# Patient Record
Sex: Female | Born: 1935 | Race: Black or African American | Hispanic: No | Smoking: Current every day smoker
Health system: Southern US, Community
[De-identification: ages and names within clinical notes are randomized; demographics above are authoritative.]

## PROBLEM LIST (undated history)

## (undated) DIAGNOSIS — R05 Cough: Secondary | ICD-10-CM

## (undated) DIAGNOSIS — I639 Cerebral infarction, unspecified: Secondary | ICD-10-CM

## (undated) DIAGNOSIS — K5900 Constipation, unspecified: Secondary | ICD-10-CM

## (undated) DIAGNOSIS — I10 Essential (primary) hypertension: Secondary | ICD-10-CM

## (undated) DIAGNOSIS — R059 Cough, unspecified: Secondary | ICD-10-CM

## (undated) DIAGNOSIS — E559 Vitamin D deficiency, unspecified: Secondary | ICD-10-CM

## (undated) DIAGNOSIS — J309 Allergic rhinitis, unspecified: Secondary | ICD-10-CM

## (undated) DIAGNOSIS — G4700 Insomnia, unspecified: Secondary | ICD-10-CM

## (undated) DIAGNOSIS — D4959 Neoplasm of unspecified behavior of other genitourinary organ: Secondary | ICD-10-CM

## (undated) DIAGNOSIS — K219 Gastro-esophageal reflux disease without esophagitis: Secondary | ICD-10-CM

## (undated) HISTORY — PX: FOOT SURGERY: SHX648

---

## 2001-02-11 ENCOUNTER — Inpatient Hospital Stay (HOSPITAL_COMMUNITY): Admission: EM | Admit: 2001-02-11 | Discharge: 2001-02-13 | Payer: Self-pay | Admitting: Emergency Medicine

## 2001-02-11 ENCOUNTER — Encounter: Payer: Self-pay | Admitting: Emergency Medicine

## 2004-01-17 ENCOUNTER — Emergency Department (HOSPITAL_COMMUNITY): Admission: EM | Admit: 2004-01-17 | Discharge: 2004-01-17 | Payer: Self-pay | Admitting: Emergency Medicine

## 2006-03-18 ENCOUNTER — Emergency Department (HOSPITAL_COMMUNITY): Admission: EM | Admit: 2006-03-18 | Discharge: 2006-03-18 | Payer: Self-pay | Admitting: Emergency Medicine

## 2006-07-02 ENCOUNTER — Ambulatory Visit: Payer: Self-pay | Admitting: *Deleted

## 2006-07-02 ENCOUNTER — Inpatient Hospital Stay (HOSPITAL_COMMUNITY): Admission: EM | Admit: 2006-07-02 | Discharge: 2006-07-08 | Payer: Self-pay | Admitting: Emergency Medicine

## 2006-07-21 ENCOUNTER — Emergency Department (HOSPITAL_COMMUNITY): Admission: EM | Admit: 2006-07-21 | Discharge: 2006-07-21 | Payer: Self-pay | Admitting: Emergency Medicine

## 2010-02-24 ENCOUNTER — Encounter: Payer: Self-pay | Admitting: Geriatric Medicine

## 2010-06-19 NOTE — Op Note (Signed)
Debra York, Debra York            ACCOUNT NO.:  [id number]   MEDICAL RECORD NO.:  [id number]          PATIENT TYPE:  INP   LOCATION:  5022                         FACILITY:  [facility]   PHYSICIAN:  Lenard Galloway. Mortenson, M.D.DATE OF BIRTH:  1924/10/14   DATE OF PROCEDURE:  07/04/2006  DATE OF DISCHARGE:                               OPERATIVE REPORT   PREOPERATIVE DIAGNOSIS:  Intertrochanteric fracture, left hip.   POSTOPERATIVE DIAGNOSIS:  Intertrochanteric fracture, left hip.   OPERATION:  Open reduction and internal fixation using PEEK compression  screw system.   SURGEON:  Lenard Galloway. Chaney Malling, M.D.   ASSISTANT:  Arlys John D. Petrarca, P.A.-C.   ANESTHESIA:  General.   PROCEDURE:  The patient placed on the fracture table in supine position.  The entire left lower extremity was prepped with Betadine and draped out  in the usual manner.  A barrier drape was used to cover the hip and the  lower extremity.  An incision was made starting at the level of the  lesser trochanter and carried down for about 2 inches.  Dissection was  then carried down to the lateral side of the proximal femur.  Using  radiographic control, a 130-degree angle finder was used and placed  against the lateral aspect of the femur.  A drill hole was made and the  guide pin was passed up the femoral neck into the head.  Excellent  position was seen in both AP and lateral views.  Using a step-cut  cortical drill over the guide pin, a hole was placed in the lateral  cortex up the femoral neck.  C-arm was used throughout, and excellent  position of the hole was achieved.  An 85-mm screw was then selected and  passed over the guide pin up into the femoral head.  Once this was  accomplished to my satisfaction and excellent position of the pin, a 130-  degree, 4-hole side plate was selected and this was placed submuscularly  and then brought back onto the compression pin.  There was posterior  sagging of the distal  femoral fragment and several attempts were made to  bring this up to a more reduced position and through the small skin  incision, this was difficult.  The incision was then extended distally  so the entire length of the side plate could be seen.  The sagging  proximal femoral shaft was then brought up into almost anatomic position  and held in place with a Lowman clamp.  Drill holes were then placed  through the side plate.  Lengths were measured and appropriate-length  screws were inserted.  A much improved position was achieved.  A locking  screw was then placed in the compression screw.  X-rays were taken and  on the AP view an excellent reduction was achieved.  The tensor fascia  lata was closed with 0 Vicryl, subcutaneous tissue was closed with  Vicryl, and stainless steel staples were used to close the skin.  A  sterile dressing was applied and the patient returned to the recovery  room in excellent condition.   DRAINS:  None.  COMPLICATIONS:  None.           ______________________________  Lenard Galloway. Chaney Malling, M.D.     RAM/MEDQ  D:  07/04/2006  T:  07/05/2006  Job:  [id number]

## 2010-06-19 NOTE — Discharge Summary (Signed)
Debra York, Debra York            ACCOUNT NO.:  [id number]   MEDICAL RECORD NO.:  [id number]          PATIENT TYPE:  INP   LOCATION:  5022                         FACILITY:  [facility]   PHYSICIAN:  Lenard Galloway. Mortenson, M.D.DATE OF BIRTH:  08-30-1924   DATE OF ADMISSION:  07/02/2006  DATE OF DISCHARGE:  07/08/2006                               DISCHARGE SUMMARY   ADMISSION DIAGNOSES:  1. Left hip intertrochanteric fracture.  2. Hypertension.  3. History of cerebrovascular accident x3.  4. Chronic constipation.  5. Probable urinary tract infection.  6. Renal insufficiency.  7. Bradycardia.  8. Anemia.  9. Blind.   DISCHARGE DIAGNOSES:  1. Status post left hip compression screw.  2. Acute on chronic renal failure, resolved.  3. Thrombocytopenia, resolved.  4. Urinary tract infection.  5. Urinary retention.  6. Hyperkalemia.  7. Left foot drop questionable secondary to previous cerebrovascular      accidents versus postoperative change.  8. Normocytic anemia.  9. Diastolic dysfunction.  10.Pericardial effusion.  11.Fever and leukocytosis, resolved.  12.Chronic obstructive pulmonary disease, stable.   HISTORY OF PRESENT ILLNESS:  The patient is a 77 year old African-  American female with left hip pain status post fall.  The patient was  taking off her pajamas on Jul 01, 2006, and fell.  The patient  complained immediately of left hip pain and inability to walk.  No other  complaints.  The patient is a poor historian.  The patient [facility] at [facility].  She is widowed and smokes 1/2 pack of cigarettes a day.  No  alcohol or drug use.  On admission the patient was noted to have  decreased sensation in the left foot felt to be secondary to previous  strokes that effected the patient's left side.   ALLERGIES:  No known drug allergies.   MEDICATIONS:  1. Lipitor 20 mg q.a.m.  2. Tylenol 325 mg two tablets p.o. t.i.d.  3. Amlodipine 10 mg q.a.m.  4. Glucosamine q.a.m.  5.  Metoprolol 25 mg p.o. b.i.d.  6. Senokot q.h.s.  7. Ambien 5 mg q.h.s.  8. Mirtazapine 15 mg 1/2 tablet p.o. q.h.s.  9. Aspirin 325 mg daily.   PROCEDURE:  The patient was taken to the operating room on Jul 04, 2006,  by Thereasa Distance A. Chaney Malling, M.D. and assisted by Oris Drone. Petrarca, P.A.-C.  The patient was placed under general anesthesia and then underwent an  open reduction and internal fixation using the Peek compression screw  system.  The patient tolerated the procedure well and returned to the  recovery room in good stable condition.   CONSULTATIONS:  The following consults were obtained while the patient  was hospitalized.  1. Medicine Teaching Service.  2. PT/OT.  3. Pharmacy.  4. Case Management.   HOSPITAL COURSE:  The patient was initially in the ER and evaluated both  by orthopedics and medicine teaching service due to left hip pain status  post fall.  The patient was found to have a left intertrochanteric  subtrochanteric femur fracture.  The patient was also found to have  renal insufficiency, bradycardia, anemia, and history  of status post  CVA, and the patient was to be worked up prior to surgery and surgery  was held until medical workup could be obtained.   On hospital day #1, the patient's TMAX was 99.6, otherwise vital signs  stable.  H&H is 8.3 and 25, white count was 10,000, platelets 2008.  The  patient with hyperkalemia and this was secondary to her acute on chronic  renal failure.  The patient with a history of hypertension, borderline  hypotensive.  Norvasc was discontinued, but the metoprolol continued for  perioperative benefits.  The patient awaiting two-dimensional  echocardiogram to evaluate heart murmur.   On Jul 04, 2006, the patient's temperature was 100.7 and vital signs  otherwise stable.  H&H is 11.9 and 35.3, white count was 4100, platelets  102,000, BUN 20, creatinine 1.22.  I&O's 2185 and 925 out.  The  patient's renal failure was  improving.  Renal ultrasound was negative.  The patient's hyperkalemia was resolving and potassium was 4.6.  H&H was  improved status post transfusion.  The patient with acute onset of  thrombocytopenia, this is to be monitored.  Two-dimensional  echocardiogram showed diastolic dysfunction and septal thickening.  Aortic valve thickness was mildly increased.  Small IVC near total  respiratory compliance questionable, the patient with dehydration.  Small pericardial effusion noted, effusion dimension 6 mm anterior x 6  mm posterior.  The patient was cleared for surgery and was later that  day taken to the operating room and underwent an ORIF of the left hip  with compression screw.   On postoperative day #1, the patient was overall feeling well.  No chest  pain and no dyspnea.  Hemoglobin is 10.6, white count 11,100, platelets  137, potassium 4.1.  The patient is afebrile, vital signs otherwise  stable.  BUN 11 and creatinine 1.1.  The patient with UTI on Cipro.  Culture; Escherichia coli resistant to Cipro and ampicillin, but  susceptible to Bactrim.  Bactrim was started.  The patient was on  metoprolol for blood pressure.  COPD was stable.   On postoperative day #2, the patient was afebrile, vital signs were  stable, and H&H was 9.4 and 28, white count was 11,500, platelets 137.  Iron studies were ordered.  The patient was found to have normocytic  anemia.  Physician advised the patient to have a colonoscopy as an  outpatient.  Iron supplement was added.  The patient is to follow up  with PCP.  Otherwise, the patient is doing well.  The patient's  metoprolol was increased to 50 b.i.d.   On postoperative day #3, the patient's H&H was 8.7 and 25.6.  Medicine  reevaluated.  It was felt that the patient's anemia was unlikely related  to surgery, possible occult GI source.  Hemoccults were negative.  The patient otherwise felt tired.  No chest pain, no shortness of breath, no  nausea, pain  under good control.  White count was 12,000, platelets 181.  The patient was noted to have good sensation to light touch in toes of  the left foot.  Unable to dorsiflex the left foot.  Positive EHL and  FHL.  Dorsal pedal pulses 2+.  Foley was removed.  The patient had to be  in-and-out catheterized over the next 24 hours secondary to urinary  retention, total of 1000 mL was obtained with in-and-out  catheterization.   On postoperative day #4, July 08, 2006, the patient was still with  urinary retention.  At that  time of rounds, Urecholine was ordered.  Prior to the Urecholine arriving to the floor, the patient was able to  void.  In regards to the patient's anemia, guaiac stools were again  negative.  H&H was 9.9 and 29.7, up from 8.7 and 25.6, respectively the  day prior.  The patient on Bactrim day #2 of 5 for UTI.  The patient's  TMAX of 100.5, vital signs otherwise were stable.  CBG's 120 and 144.  The patient's had positive bowel movements.  EHL and FHL's were out,  previous day were in on the left.  No dorsiflexion or plantar flexion  left foot.  AFO was ordered to be worn at all times especially when up  ambulating.  However, the AFO does need to be removed approximately 4  hours for skin checks and the patient's comfort.  The patient was  otherwise felt to be in good stable condition and ready for discharge to  a skilled nursing facility.   LABORATORY DATA:  Routine labs on admission.  CBC; white blood cell count 11,600 elevated, hemoglobin 11.4, hematocrit  34.8, platelets 249.  Routine chemistries on admission; sodium 136,  potassium elevated at 5.5 mEq per liter, chloride 108, bicarb 21,  glucose high at 143 mg/dL, BUN 41 mg/dL, and creatinine was also  elevated at 136 mg/dL.  Hepatic enzymes on admission; ALP was low at  3.3, AST 29, ALT 18, ALP 91, total bilirubin 0.4.  Coags on admission;  PT 13.3, INR 1.0, PTT 26.  Urinalysis on admission; nitrites positive,  small  leukocyte esterase, bacteria few.  Urine culture from Jul 02, 2006, showed Escherichia coli greater than 100,000 colonies per mL  susceptible to Bactrim.   X-RAYS:  Left hip postoperatively showed two fluoroscopic intraoperative  spot films left hip demonstrating placement of dynamic hip screw for  fixation of the intertrochanteric fracture.   Left hip dated Jul 02, 2006, showed angulated comminuted  intertrochanteric fracture of the left femur.   Jul 02, 2006, lumbar spine complete showed degenerative changes without  acute osseous abnormalities, moderate osteopenia.   Chest x-ray two-view on Jul 02, 2006, showed no acute cardiopulmonary  disease, mild cardiomegaly with probable mild COPD.   Echocardiogram dated Jul 03, 2006, showed left ventricle small, overall  left ventricular systolic function was hyperdynamic, left ventricular  ejection fraction was estimated in the range of 65-70%.  No left ventricular regional wall abnormalities.  Mild focal basal septal  hypertrophy.  Doppler evidence of dynamic left ventricular mid cavity  obstruction at rest with peak velocity of 3 mm per second and with a  peak gradient of 36 mmHg consistent with an abnormal left ventricular  relaxation.  Aortic valve thickness was mildly increased.  Small IVC  with near total respiratory collapse, questionable dehydration.  Small  pericardial effusion measuring 6 mm anterior x 6 mm posterior.   EKG dated Jul 02, 2006, showed sinus bradycardia with a heart rate of 56  beats per minute, PR interval was 204 milliseconds, PRT axis was 76, 42,  81.   MEDICINES ON FLOOR:  1. Multivitamin one p.o. daily.  2. Remeron 7.5 one p.o. q.h.s.  3. Senna one tablet p.o. b.i.d.  4. Simvastatin 40 mg one p.o. daily at 1800 hours.  5. Protonix 40 mg p.o. daily.  6. Colace 100 mg one p.o. b.i.d.  7. Lovenox 40 mg subcu q.24 hours started on Jul 05, 2006, to be      continued for a total  of 14 days and the patient to  resume aspirin.  8. Bactrim one p.o. b.i.d. starting on July 06, 2006, for a total of 5      days.  9. Ferrous sulfate 325 mg one p.o. b.i.d.  10.Lopressor 50 mg one p.o. b.i.d.  11.Robaxin 500 mg IV q.6 hours p.r.n., last taken was Jul 05, 2006.  12.Ambien 5 mg one p.o. q.h.s. p.r.n.  None taken.  13.Albuterol two puffs q.4 hours p.r.n.  14.Vicodin 5/325 one tablet p.o. q.4 hours p.r.n.  15.Zofran 2-4 mg IV q.8 hours p.r.n. nausea.  16.Morphine 1 to 3 mg IV q.4 hours p.r.n.   DISCHARGE MEDICATIONS:  Floor medicines are to be adjusted by skilled  nursing facility physician.   DIET:  The patient is to have a regular diet.   WOUND CARE:  The wound is to be kept clean and dry.  Change dressing  daily.  The patient may shower after two days with no drainage.  Staples  to be removed at follow-up 14 days postoperatively.   FOLLOWUP:  The patient needs follow up with Dr. Chaney Malling in the office  14 days from surgery.  Please call [phone] for appointment.   ACTIVITY:  The patient is nonweightbearing with walker on the left leg.  The patient needs AFO applied when up ambulating.  AFO is to be removed  for skin checks daily and also to be removed for a total of 4 hours  daily for the patient's comfort.   DISCHARGE INSTRUCTIONS:  The patient needs colonoscopy as an outpatient.   CONDITION ON DISCHARGE:  The patient was discharged to skilled nursing  facility in good stable condition.      Richardean Canal, P.A.    ______________________________  Lenard Galloway Chaney Malling, M.D.    GC/MEDQ  D:  07/08/2006  T:  07/08/2006  Job:  [id number]   cc:   Thereasa Distance A. Chaney Malling, M.D.

## 2010-06-22 NOTE — Discharge Summary (Signed)
[place]  Patient:    Debra York, Debra York Visit Number: [id number] MRN: [id number]          Service Type: MED Location: [phone] Attending Physician:  Alfonso Ramus Dictated by:   Lendell Caprice, M.D. Admit Date:  02/11/2001 Disc. Date: 02/13/01   CC:         Dr. Ronn Melena, [facility], [place]  Patients physician, [facility] nursing home  Dr. Faylene Million, [place], [place]   Discharge Summary  DISCHARGE DIAGNOSES: 1. Left-sided facial droop, rule out cerebrovascular accident. 2. History of cerebrovascular accident x 3 in the past. 3. History of cerebral aneurysm clip, circle of Willis, in 1975. 4. History of uncontrolled hypertension. 5. History of recent foot surgery status post removal of desmoid bone on the    left by Dr. Faylene Million, podiatrist in [place]. 6. History of miscarriage in the past.  DISCHARGE MEDICATIONS: 1. Aspirin 325 mg p.o. q.d. 2. Keflex per Dr. Faylene Million, resume dose prior to admission. 3. Lamisil, resume dose prior to admission per Dr. Faylene Million 4. Tylenol 325 mg one to two tablets p.o. q.4h. p.r.n. pain.  FOLLOW-UP:  Debra York will be discharged to her previous nursing home where she lives in [place], [place].  She will return to [facility] on February 13, 2001.  She will need a regular hospital follow-up visit by the physician there at her nursing home.  There is no lab data for the physician at the nursing home to check on follow-up.  The patient did report constipation on admission and it would be important to assure that she is having regular bowel movements at follow-up.  PROCEDURES AND DIAGNOSTIC STUDIES:  A CT scan of the head was performed. Impression:  No definite acute intracranial disease.  There is an old right circle of Willis aneurysm clip with postsurgical changes and extensive right parietal encephalomalacia.  A 2-D echo performed on February 12, 2001, report pending at the time of discharge.   This will be sent to the nursing home after it is available.  Carotid Dopplers.  Impression:  Right internal carotid artery stenosis 60 to 80% estimation, otherwise negative.  CONSULTANTS:  None.  HISTORY OF PRESENT ILLNESS:  Debra York is a 77 year old African-American female with a past medical history of stroke x 3 with residual left arm and left leg hemiparesis.  She reports her first stroke was in 1975 and is unsure of the dates of her other two strokes.  She also has a past medical history of hypertension but takes no medication for her hypertension.  She presented to the emergency room from her nursing home, [facility], with the complaints of new onset slurred speech.  She also reports difficulty finding her words and new onset left facial droop with one episode of confusion this morning. According to the nursing home staff, the patient was in her usual state of health until the morning of admission around 10 a.m. when she became acutely confused.  They reported that she started slurring her words, had difficulty finding her words, and had new onset left-sided facial droop.  She was taken to the [facility] emergency room for evaluation and had improved by the time of her arrival.  She denied any other complaints in the emergency room.  She denied chest pain, nausea, vomiting, abdominal pain, vision changes, shortness of breath, fevers, and headache.  She also denied any recent change in her medications, trauma, recent travel, or sick contacts.  She denies any  syncope or loss of consciousness.  She does report occasional dizziness each morning when she wakes up.  ADMISSION LABORATORIES:  White blood count 4.2, hemoglobin 12.6, platelets 228, PT 13.4, PTT 26.  Sodium 137, potassium 4.0, chloride 104, CO2 28, BUN 17, creatinine 1.1, glucose 86, total protein 6.9, AST 32, ALT 18, albumin 3.7, alkaline phosphatase 65, total bilirubin 0.5.  HOSPITAL COURSE: #1 - PROBABLE  CEREBROVASCULAR ACCIDENT:  Debra York was evaluated in the emergency room and given aspirin on admission.  She was admitted to telemetry for observation for potential arrhythmias.  Her EKG was normal sinus rhythm at the rate of 59 beats per minute and no evidence of atrial fibrillation o arrhythmias.  She was put on stroke precautions and started on a dysphagia II diet in light of her slurred speech.  Physical therapy and occupational therapy consults were ordered.  She was put on aspiration precautions.  An MRI was unable to be obtained secondary to the patients extremely old aneurysm clip likely from 6.  Because of this clip, she is unable to undergo imaging in the MRI.  She was continued on her previous medications of Lamisil, Keflex, and given Colace for constipation.  She was evaluated overnight and had no events on telemetry.  On the day after admission, her symptoms had mostly resolved.  Her facial droop had improved and her speech had also improved. She denied any complaints of confusion, slurred speech, or facial droop on hospital day #2.  She will be discharged on aspirin 325 mg p.o. q.d.  No follow-up is necessary for her probable CVA.  She had no evidence of bleed on CT scan of her head, noncontrasted.  According to her carotid Dopplers, there was a right ICA 60 to 80% stenosis, otherwise negative.  A 2-D echocardiogram report will be available after discharge and this will be given to the nursing home.  Patient will be discharged on aspirin.  #2 - STATUS POST RECENT FOOT SURGERY:  The patient was continued on her medications that she came in on including Lamisil and Keflex per her podiatrist.  Her podiatrist, Dr. Faylene Million, was contacted about the patients admission and he will see the patient in follow-up after discharge.  #3 - CONSTIPATION:  The patient was given Colace and a suppository and had a  bowel movement during her hospitalization.  This patient might benefit  from a stool softener which can be added to her medication list after discharge.  #4 - NORMOCYTIC ANEMIA:  Patients hemoglobin on admission was 12.6 and, on hospital day #2, her hemoglobin was 11.0 with an MCV of 90.3.  Patient was having no evidence of bleeding, no hematemesis, no melena, no hematochezia. She denied any previous history of anemia.  An anemia workup was pursued and these results are pending at this time.  These will be followed up and sent to the nursing home after they are available.  A B12, RBC, folate, iron studies, and serum ferritin were obtained to work up her anemia.  In light of the normocytic nature of this anemia, it is unlikely this is secondary to iron deficiency.  #5 - DISPOSITION:  The patient will be discharged back to her nursing home at [facility].  In working this patient up, a urinalysis was performed which was negative for infection.  A fasting lipid panel was also obtained.  This patient does not need medication to treat her lipids in that they were within normal limits.  This patients symptoms had improved  by hospital day #2 and she will be discharged on hospital day #3 back to her nursing home.  She will need to be followed up by the [facility] physician in approximately one to two weeks.  DISCHARGE LABORATORIES:  Sodium 139, potassium 3.7, chloride 108, CO2 25, BUN 13, creatinine 1.0, calcium 8.4, glucose 85.  White blood count 4.9, hemoglobin 11, MCV 90.3, platelets 207. Dictated by:   Lendell Caprice, M.D. Attending Physician:  Alfonso Ramus DD:  02/12/01 TD:  02/12/01 Job: [id number]

## 2010-06-22 NOTE — Discharge Summary (Signed)
[place]  Patient:    Debra York, MCBREARTY Visit Number: [id number] MRN: [id number]          Service Type: MED Location: [phone] Attending Physician:  Alfonso Ramus Dictated by:   Lendell Caprice, M.D. Admit Date:  02/11/2001 Discharge Date: 02/13/2001   CC:         [facility]             Dr. Suzette Battiest, [place], [place]                           Discharge Summary  ADDENDUM   A 2D echocardiogram report obtained on Geneva Lenk performed on February 13, 2001, with impression of overall left ventricular systolic function was normal.  Left ventricular ejection fraction with estimated range being 55-65%. There were no left ventricular regional wall motion abnormalities.  Left ventricular wall thickness was mildly increased.  There was mild, focal, basal, septal hypertrophy.  [facility] should arrange for Ms. Westerhold to get a followup CT scan in one months time to further evaluate probable CVA.  The patient was discharged on aspirin 325 mg p.o. q.d.Dictated by:   Lendell Caprice, M.D. Attending Physician:  Alfonso Ramus DD:  02/14/01 TD:  02/16/01 Job: [id number]

## 2010-11-22 LAB — BASIC METABOLIC PANEL
BUN: 10
CO2: 26
CO2: 26
Calcium: 8 — ABNORMAL LOW
Calcium: 8.1 — ABNORMAL LOW
Chloride: 105
Creatinine, Ser: 1.05
Creatinine, Ser: 1.13
GFR calc Af Amer: 56 — ABNORMAL LOW
GFR calc non Af Amer: 46 — ABNORMAL LOW
Glucose, Bld: 118 — ABNORMAL HIGH

## 2010-11-22 LAB — RENAL FUNCTION PANEL
Albumin: 2.2 — ABNORMAL LOW
BUN: 7
Chloride: 105
GFR calc Af Amer: >60
GFR calc non Af Amer: 50 — ABNORMAL LOW
Potassium: 3.7

## 2010-11-22 LAB — CBC
HCT: 29.7 — ABNORMAL LOW
MCHC: 33.7
MCHC: 34.1
MCV: 91.3
Platelets: 137 — ABNORMAL LOW
Platelets: 207
RBC: 2.83 — ABNORMAL LOW
RBC: 3.06 — ABNORMAL LOW
RBC: 3.25 — ABNORMAL LOW
RDW: 13.8
WBC: 10.9 — ABNORMAL HIGH

## 2010-11-22 LAB — OCCULT BLOOD X 1 CARD TO LAB, STOOL: Fecal Occult Bld: NEGATIVE

## 2014-09-26 ENCOUNTER — Encounter (HOSPITAL_COMMUNITY): Payer: Self-pay

## 2014-09-26 ENCOUNTER — Emergency Department (HOSPITAL_COMMUNITY): Payer: Medicare Other

## 2014-09-26 ENCOUNTER — Inpatient Hospital Stay (HOSPITAL_COMMUNITY)
Admission: EM | Admit: 2014-09-26 | Discharge: 2014-09-28 | DRG: 082 | Payer: Medicare Other | Attending: Internal Medicine | Admitting: Internal Medicine

## 2014-09-26 DIAGNOSIS — S0081XA Abrasion of other part of head, initial encounter: Secondary | ICD-10-CM | POA: Diagnosis present

## 2014-09-26 DIAGNOSIS — I671 Cerebral aneurysm, nonruptured: Secondary | ICD-10-CM | POA: Diagnosis present

## 2014-09-26 DIAGNOSIS — W07XXXA Fall from chair, initial encounter: Secondary | ICD-10-CM | POA: Diagnosis present

## 2014-09-26 DIAGNOSIS — R748 Abnormal levels of other serum enzymes: Secondary | ICD-10-CM | POA: Diagnosis present

## 2014-09-26 DIAGNOSIS — S065X9A Traumatic subdural hemorrhage with loss of consciousness of unspecified duration, initial encounter: Secondary | ICD-10-CM

## 2014-09-26 DIAGNOSIS — E785 Hyperlipidemia, unspecified: Secondary | ICD-10-CM | POA: Diagnosis not present

## 2014-09-26 DIAGNOSIS — Y92129 Unspecified place in nursing home as the place of occurrence of the external cause: Secondary | ICD-10-CM | POA: Diagnosis not present

## 2014-09-26 DIAGNOSIS — Z79899 Other long term (current) drug therapy: Secondary | ICD-10-CM | POA: Diagnosis not present

## 2014-09-26 DIAGNOSIS — I69354 Hemiplegia and hemiparesis following cerebral infarction affecting left non-dominant side: Secondary | ICD-10-CM | POA: Diagnosis not present

## 2014-09-26 DIAGNOSIS — J309 Allergic rhinitis, unspecified: Secondary | ICD-10-CM | POA: Diagnosis present

## 2014-09-26 DIAGNOSIS — I6200 Nontraumatic subdural hemorrhage, unspecified: Secondary | ICD-10-CM | POA: Diagnosis not present

## 2014-09-26 DIAGNOSIS — F17210 Nicotine dependence, cigarettes, uncomplicated: Secondary | ICD-10-CM | POA: Diagnosis present

## 2014-09-26 DIAGNOSIS — E559 Vitamin D deficiency, unspecified: Secondary | ICD-10-CM | POA: Diagnosis present

## 2014-09-26 DIAGNOSIS — R0600 Dyspnea, unspecified: Secondary | ICD-10-CM | POA: Diagnosis not present

## 2014-09-26 DIAGNOSIS — I509 Heart failure, unspecified: Secondary | ICD-10-CM

## 2014-09-26 DIAGNOSIS — E876 Hypokalemia: Secondary | ICD-10-CM | POA: Diagnosis present

## 2014-09-26 DIAGNOSIS — S065XAA Traumatic subdural hemorrhage with loss of consciousness status unknown, initial encounter: Secondary | ICD-10-CM

## 2014-09-26 DIAGNOSIS — R0902 Hypoxemia: Secondary | ICD-10-CM | POA: Diagnosis present

## 2014-09-26 DIAGNOSIS — I10 Essential (primary) hypertension: Secondary | ICD-10-CM | POA: Diagnosis present

## 2014-09-26 DIAGNOSIS — Z7982 Long term (current) use of aspirin: Secondary | ICD-10-CM

## 2014-09-26 DIAGNOSIS — J189 Pneumonia, unspecified organism: Secondary | ICD-10-CM | POA: Diagnosis present

## 2014-09-26 DIAGNOSIS — D649 Anemia, unspecified: Secondary | ICD-10-CM | POA: Diagnosis present

## 2014-09-26 DIAGNOSIS — Z8673 Personal history of transient ischemic attack (TIA), and cerebral infarction without residual deficits: Secondary | ICD-10-CM

## 2014-09-26 DIAGNOSIS — K219 Gastro-esophageal reflux disease without esophagitis: Secondary | ICD-10-CM | POA: Diagnosis present

## 2014-09-26 DIAGNOSIS — Z66 Do not resuscitate: Secondary | ICD-10-CM | POA: Diagnosis present

## 2014-09-26 HISTORY — DX: Gastro-esophageal reflux disease without esophagitis: K21.9

## 2014-09-26 HISTORY — DX: Essential (primary) hypertension: I10

## 2014-09-26 HISTORY — DX: Cough: R05

## 2014-09-26 HISTORY — DX: Vitamin D deficiency, unspecified: E55.9

## 2014-09-26 HISTORY — DX: Cough, unspecified: R05.9

## 2014-09-26 HISTORY — DX: Constipation, unspecified: K59.00

## 2014-09-26 HISTORY — DX: Cerebral infarction, unspecified: I63.9

## 2014-09-26 HISTORY — DX: Neoplasm of unspecified behavior of other genitourinary organ: D49.59

## 2014-09-26 HISTORY — DX: Allergic rhinitis, unspecified: J30.9

## 2014-09-26 HISTORY — DX: Insomnia, unspecified: G47.00

## 2014-09-26 LAB — CBC WITH DIFFERENTIAL/PLATELET
BASOS ABS: 0 10*3/uL (ref 0.0–0.1)
Basophils Relative: 0 % (ref 0–1)
EOS ABS: 0.1 10*3/uL (ref 0.0–0.7)
EOS PCT: 2 % (ref 0–5)
HCT: 40.5 % (ref 36.0–46.0)
Hemoglobin: 12.8 g/dL (ref 12.0–15.0)
Lymphocytes Relative: 20 % (ref 12–46)
Lymphs Abs: 1.3 10*3/uL (ref 0.7–4.0)
MCH: 27.5 pg (ref 26.0–34.0)
MCHC: 31.6 g/dL (ref 30.0–36.0)
MCV: 86.9 fL (ref 78.0–100.0)
Monocytes Absolute: 1 10*3/uL (ref 0.1–1.0)
Monocytes Relative: 14 % — ABNORMAL HIGH (ref 3–12)
Neutro Abs: 4.4 10*3/uL (ref 1.7–7.7)
Neutrophils Relative %: 64 % (ref 43–77)
PLATELETS: 211 10*3/uL (ref 150–400)
RBC: 4.66 MIL/uL (ref 3.87–5.11)
RDW: 15.2 % (ref 11.5–15.5)
WBC: 6.9 10*3/uL (ref 4.0–10.5)

## 2014-09-26 LAB — I-STAT CHEM 8, ED
BUN: 14 mg/dL (ref 6–20)
CHLORIDE: 99 mmol/L — AB (ref 101–111)
CREATININE: 0.8 mg/dL (ref 0.44–1.00)
Calcium, Ion: 1.13 mmol/L (ref 1.13–1.30)
Glucose, Bld: 111 mg/dL — ABNORMAL HIGH (ref 65–99)
HEMATOCRIT: 44 % (ref 36.0–46.0)
Hemoglobin: 15 g/dL (ref 12.0–15.0)
Potassium: 3.3 mmol/L — ABNORMAL LOW (ref 3.5–5.1)
Sodium: 142 mmol/L (ref 135–145)
TCO2: 29 mmol/L (ref 0–100)

## 2014-09-26 LAB — COMPREHENSIVE METABOLIC PANEL
ALT: 15 U/L (ref 14–54)
AST: 24 U/L (ref 15–41)
Albumin: 3.3 g/dL — ABNORMAL LOW (ref 3.5–5.0)
Alkaline Phosphatase: 75 U/L (ref 38–126)
Anion gap: 7 (ref 5–15)
BUN: 14 mg/dL (ref 6–20)
CHLORIDE: 104 mmol/L (ref 101–111)
CO2: 30 mmol/L (ref 22–32)
CREATININE: 0.67 mg/dL (ref 0.44–1.00)
Calcium: 8.8 mg/dL — ABNORMAL LOW (ref 8.9–10.3)
GFR calc non Af Amer: >60 mL/min (ref >60)
Glucose, Bld: 112 mg/dL — ABNORMAL HIGH (ref 65–99)
Potassium: 3.3 mmol/L — ABNORMAL LOW (ref 3.5–5.1)
SODIUM: 141 mmol/L (ref 135–145)
Total Bilirubin: 1.2 mg/dL (ref 0.3–1.2)
Total Protein: 6.9 g/dL (ref 6.5–8.1)

## 2014-09-26 LAB — I-STAT CG4 LACTIC ACID, ED: Lactic Acid, Venous: 1.14 mmol/L (ref 0.5–2.0)

## 2014-09-26 LAB — BRAIN NATRIURETIC PEPTIDE: B Natriuretic Peptide: 1808.5 pg/mL — ABNORMAL HIGH (ref 0.0–100.0)

## 2014-09-26 MED ORDER — HYDRALAZINE HCL 20 MG/ML IJ SOLN
5.0000 mg | Freq: Once | INTRAMUSCULAR | Status: AC
  Administered 2014-09-26: 5 mg via INTRAVENOUS
  Filled 2014-09-26: qty 1

## 2014-09-26 NOTE — ED Notes (Signed)
MD made aware of elevated BP 

## 2014-09-26 NOTE — ED Notes (Addendum)
Per [facility]- pt resides at [facility]. FULL CODE.- Unwitnessed fall- downtime unknown. Found by residences.  Per staff at baseline. [facility] cleared. Laceration to the left side of forehead.-bleeding controlled. Pt without complaints. Pt states she was sitting in wheelchair and wheelchair went slightly off drop off and she went out of chair landing face first.

## 2014-09-26 NOTE — ED Notes (Signed)
Nurse drawing labs. 

## 2014-09-26 NOTE — ED Provider Notes (Signed)
CSN: [id number]     Arrival date & time 09/26/14  1813 History   First MD Initiated Contact with Patient 09/26/14 1832     Chief Complaint  Patient presents with  . Fall  . Laceration     (Consider location/radiation/quality/duration/timing/severity/associated sxs/prior Treatment) Patient is a 81 y.o. female presenting with fall and skin laceration. The history is provided by the patient (pt fell from wheelchair and hit her head).  Fall This is a new problem. The current episode started 3 to 5 hours ago. The problem occurs constantly. The problem has not changed since onset.Associated symptoms include headaches. Pertinent negatives include no chest pain and no abdominal pain. Nothing aggravates the symptoms. Nothing relieves the symptoms.  Laceration   Past Medical History  Diagnosis Date  . Hypertension   . Constipation   . Cerebral vascular accident   . Uterine neoplasm   . Cough   . Insomnia   . Gastroesophageal reflux disease   . Allergic rhinitis   . Vitamin D deficiency    No past surgical history on file. No family history on file. Social History  Substance Use Topics  . Smoking status: None  . Smokeless tobacco: None  . Alcohol Use: None   OB History    No data available     Review of Systems  Constitutional: Negative for appetite change and fatigue.  HENT: Negative for congestion, ear discharge and sinus pressure.   Eyes: Negative for discharge.  Respiratory: Positive for cough.   Cardiovascular: Negative for chest pain.  Gastrointestinal: Negative for abdominal pain and diarrhea.  Genitourinary: Negative for frequency and hematuria.  Musculoskeletal: Negative for back pain.  Skin: Negative for rash.  Neurological: Positive for headaches. Negative for seizures.  Psychiatric/Behavioral: Negative for hallucinations.      Allergies  Review of patient's allergies indicates no known allergies.  Home Medications   Prior to Admission medications    Medication Sig Start Date End Date Taking? Authorizing Provider  acetaminophen (TYLENOL) 325 MG tablet Take 650 mg by mouth every 6 (six) hours as needed for mild pain, fever or headache.   Yes Historical Provider, MD  aspirin 81 MG chewable tablet Chew 81 mg by mouth daily.   Yes Historical Provider, MD  cetirizine (ZYRTEC) 10 MG tablet Take 10 mg by mouth daily.   Yes Historical Provider, MD  cholecalciferol (VITAMIN D) 400 UNITS TABS tablet Take 800 Units by mouth.   Yes Historical Provider, MD  ferrous sulfate 325 (65 FE) MG tablet Take 325 mg by mouth daily with breakfast.   Yes Historical Provider, MD  hydrALAZINE (APRESOLINE) 25 MG tablet Take 25 mg by mouth 3 (three) times daily.   Yes Historical Provider, MD  hydrocerin (EUCERIN) CREA Apply 1 application topically 2 (two) times daily. To legs and feet.   Yes Historical Provider, MD  metoprolol (LOPRESSOR) 100 MG tablet Take 100 mg by mouth 2 (two) times daily.   Yes Historical Provider, MD  simvastatin (ZOCOR) 10 MG tablet Take 10 mg by mouth daily.   Yes Historical Provider, MD  sucralfate (CARAFATE) 1 G tablet Take 1 g by mouth 4 (four) times daily -  with meals and at bedtime.   Yes Historical Provider, MD   BP 187/85 mmHg  Pulse 62  Temp(Src) 97.8 F (36.6 C) (Oral)  Resp 18  SpO2 93% Physical Exam  Constitutional: She is oriented to person, place, and time. She appears well-developed.  HENT:  Head: Normocephalic.  Abrasion left forehead  Eyes: Conjunctivae and EOM are normal. No scleral icterus.  Neck: Neck supple. No thyromegaly present.  Cardiovascular: Normal rate and regular rhythm.  Exam reveals no gallop and no friction rub.   No murmur heard. Pulmonary/Chest: No stridor. She has no wheezes. She has rales. She exhibits no tenderness.  Abdominal: She exhibits no distension. There is no tenderness. There is no rebound.  Musculoskeletal: Normal range of motion. She exhibits no edema.  Right hip tender   Lymphadenopathy:    She has no cervical adenopathy.  Neurological: She is alert and oriented to person, place, and time.  Skin: No rash noted. No erythema.  Psychiatric: She has a normal mood and affect. Her behavior is normal.    ED Course  Procedures (including critical care time) Labs Review Labs Reviewed  CBC WITH DIFFERENTIAL/PLATELET - Abnormal; Notable for the following:    Monocytes Relative 14 (*)    All other components within normal limits  COMPREHENSIVE METABOLIC PANEL - Abnormal; Notable for the following:    Potassium 3.3 (*)    Glucose, Bld 112 (*)    Calcium 8.8 (*)    Albumin 3.3 (*)    All other components within normal limits  BRAIN NATRIURETIC PEPTIDE - Abnormal; Notable for the following:    B Natriuretic Peptide 1808.5 (*)    All other components within normal limits  I-STAT CHEM 8, ED - Abnormal; Notable for the following:    Potassium 3.3 (*)    Chloride 99 (*)    Glucose, Bld 111 (*)    All other components within normal limits  I-STAT CG4 LACTIC ACID, ED    Imaging Review Ct Head Wo Contrast  09/26/2014   CLINICAL DATA:  Unwitnessed fall. Down time unknown. Laceration to the left side of forehead. Patient fell out of wheelchair.  EXAM: CT HEAD WITHOUT CONTRAST  CT CERVICAL SPINE WITHOUT CONTRAST  TECHNIQUE: Multidetector CT imaging of the head and cervical spine was performed following the standard protocol without intravenous contrast. Multiplanar CT image reconstructions of the cervical spine were also generated.  COMPARISON:  None.  FINDINGS: CT HEAD FINDINGS  Postoperative changes with right temporal craniotomy and surgical clip in the right middle cranial fossa consistent with aneurysm clip. Encephalomalacia in the right frontotemporal and parietal lobe. Prominent ventricular dilatation consistent with volume loss. Diffuse cerebral atrophy.  There is focal acute subdural hematoma along the right frontoparietal region. Maximal depth is measured at  about 1 cm although most of the subdural hematoma is not that large. There is no significant mass effect or midline shift. No sub arachnoid, intraventricular, or intraparenchymal hemorrhage. No depressed skull fractures. Retention cysts in the right maxillary antrum and left sphenoid sinus. Mastoid air cells are not opacified. Vascular calcifications.  CT CERVICAL SPINE FINDINGS  There is reversal of the usual cervical lordosis, likely degenerative. No anterior subluxation. Normal alignment of facet joints. Coalition of the posterior elements at C3-4 bilaterally. No vertebral compression deformities. No prevertebral soft tissue swelling. Diffuse degenerative change throughout the cervical spine with narrowed cervical interspaces and endplate hypertrophic changes throughout. Degenerative changes throughout the facet joints. C1-2 articulation appears intact. Extensive vascular calcifications in the cervical carotid vessels. This likely results in severe calcific stenosis of both common carotid arteries. Mid bilateral pleural effusions, greater on the left. Emphysematous changes and fibrosis in the lung apices. Visualized esophagus appears dilated with an air-fluid level. This may be due dysmotility or distal esophageal narrowing.  IMPRESSION: Acute right subdural hematoma with maximal  depth of 1 cm. No mass effect or midline shift. Postoperative changes consistent with aneurysm clipping on the right with chronic encephalomalacia and cerebral atrophy.  Diffuse degenerative changes throughout the cervical spine likely accounts for reversal of cervical lordosis. No acute displaced fractures identified.  These results were called by telephone at the time of interpretation on 09/26/2014 at 8:51 pm to Dr. Milton Ferguson , who verbally acknowledged these results.   Electronically Signed   By: Lucienne Capers M.D.   On: 09/26/2014 20:59   Ct Cervical Spine Wo Contrast  09/26/2014   CLINICAL DATA:  Unwitnessed fall. Down  time unknown. Laceration to the left side of forehead. Patient fell out of wheelchair.  EXAM: CT HEAD WITHOUT CONTRAST  CT CERVICAL SPINE WITHOUT CONTRAST  TECHNIQUE: Multidetector CT imaging of the head and cervical spine was performed following the standard protocol without intravenous contrast. Multiplanar CT image reconstructions of the cervical spine were also generated.  COMPARISON:  None.  FINDINGS: CT HEAD FINDINGS  Postoperative changes with right temporal craniotomy and surgical clip in the right middle cranial fossa consistent with aneurysm clip. Encephalomalacia in the right frontotemporal and parietal lobe. Prominent ventricular dilatation consistent with volume loss. Diffuse cerebral atrophy.  There is focal acute subdural hematoma along the right frontoparietal region. Maximal depth is measured at about 1 cm although most of the subdural hematoma is not that large. There is no significant mass effect or midline shift. No sub arachnoid, intraventricular, or intraparenchymal hemorrhage. No depressed skull fractures. Retention cysts in the right maxillary antrum and left sphenoid sinus. Mastoid air cells are not opacified. Vascular calcifications.  CT CERVICAL SPINE FINDINGS  There is reversal of the usual cervical lordosis, likely degenerative. No anterior subluxation. Normal alignment of facet joints. Coalition of the posterior elements at C3-4 bilaterally. No vertebral compression deformities. No prevertebral soft tissue swelling. Diffuse degenerative change throughout the cervical spine with narrowed cervical interspaces and endplate hypertrophic changes throughout. Degenerative changes throughout the facet joints. C1-2 articulation appears intact. Extensive vascular calcifications in the cervical carotid vessels. This likely results in severe calcific stenosis of both common carotid arteries. Mid bilateral pleural effusions, greater on the left. Emphysematous changes and fibrosis in the lung apices.  Visualized esophagus appears dilated with an air-fluid level. This may be due dysmotility or distal esophageal narrowing.  IMPRESSION: Acute right subdural hematoma with maximal depth of 1 cm. No mass effect or midline shift. Postoperative changes consistent with aneurysm clipping on the right with chronic encephalomalacia and cerebral atrophy.  Diffuse degenerative changes throughout the cervical spine likely accounts for reversal of cervical lordosis. No acute displaced fractures identified.  These results were called by telephone at the time of interpretation on 09/26/2014 at 8:51 pm to Dr. Milton Ferguson , who verbally acknowledged these results.   Electronically Signed   By: Lucienne Capers M.D.   On: 09/26/2014 20:59   Dg Pelvis Portable  09/26/2014   CLINICAL DATA:  [address] out of wheelchair, unwitnessed fall at nursing home  EXAM: PORTABLE PELVIS 1-2 VIEWS  COMPARISON:  07/02/2006  FINDINGS: Osseous demineralization.  Hip and SI joints grossly symmetric and preserved.  Orthopedic hardware proximal LEFT femur post ORIF of an intertrochanteric fracture.  No definite acute fracture, dislocation, or bone destruction.  Soft tissue swelling laterally at LEFT hip region.  Extensive atherosclerotic calcifications.  IMPRESSION: Osseous demineralization with prior ORIF of the proximal LEFT femur.  No definite acute bony abnormalities.   Electronically Signed   By: Elta Guadeloupe  Thornton Papas M.D.   On: 09/26/2014 21:56   Dg Chest Port 1 View  09/26/2014   CLINICAL DATA:  Shortness of breath.  EXAM: PORTABLE CHEST - 1 VIEW  COMPARISON:  07/02/2006  FINDINGS: Mild cardiac enlargement noted. Diffuse bilateral hazy lung opacities are identified compatible with pulmonary edema. A more focal opacity is noted within the right midlung. The visualized osseous structures appear intact.  IMPRESSION: 1. Cardiac enlargement and pulmonary edema. 2. Focal opacity within the right midlung may represent superimposed pneumonia. Followup PA and  lateral chest X-ray is recommended in 3-4 weeks following trial of antibiotic therapy to ensure resolution and exclude underlying malignancy.   Electronically Signed   By: Kerby Moors M.D.   On: 09/26/2014 21:57   I have personally reviewed and evaluated these images and lab results as part of my medical decision-making. CRITICAL CARE Performed by: Corwyn Vora L Total critical care time: 45 Critical care time was exclusive of separately billable procedures and treating other patients. Critical care was necessary to treat or prevent imminent or life-threatening deterioration. Critical care was time spent personally by me on the following activities: development of treatment plan with patient and/or surrogate as well as nursing, discussions with consultants, evaluation of patient's response to treatment, examination of patient, obtaining history from patient or surrogate, ordering and performing treatments and interventions, ordering and review of laboratory studies, ordering and review of radiographic studies, pulse oximetry and re-evaluation of patient's condition.   EKG Interpretation   Date/Time:  Monday September 26 2014 21:47:12 EDT Ventricular Rate:  62 PR Interval:  163 QRS Duration: 139 QT Interval:  474 QTC Calculation: 481 R Axis:   162 Text Interpretation:  Sinus rhythm Consider left ventricular hypertrophy  Nonspecific T abnormalities, inferior leads Confirmed by Skarlette Lattner  MD,  Elhadj Girton [phone]) on 09/26/2014 10:55:21 PM     I spoke to neurosurgery about the subdural,  Neurosurgery reviewed the ct and will see pt in am. Pt needs new head ct in am MDM   Final diagnoses:  Subdural hematoma    Subdural hematoma,  Admit to medicine with neurosurgery consulted    Milton Ferguson, MD 09/26/14 2323

## 2014-09-26 NOTE — ED Notes (Signed)
MD at bedside. 

## 2014-09-26 NOTE — ED Notes (Signed)
Bed: HJ64 Expected date:  Expected time:  Means of arrival:  Comments: EMS/83F/fall/head lac

## 2014-09-27 ENCOUNTER — Inpatient Hospital Stay (HOSPITAL_COMMUNITY): Payer: Medicare Other

## 2014-09-27 ENCOUNTER — Encounter (HOSPITAL_COMMUNITY): Payer: Self-pay | Admitting: Internal Medicine

## 2014-09-27 DIAGNOSIS — I10 Essential (primary) hypertension: Secondary | ICD-10-CM | POA: Diagnosis present

## 2014-09-27 DIAGNOSIS — I6200 Nontraumatic subdural hemorrhage, unspecified: Secondary | ICD-10-CM

## 2014-09-27 DIAGNOSIS — I509 Heart failure, unspecified: Secondary | ICD-10-CM

## 2014-09-27 DIAGNOSIS — E785 Hyperlipidemia, unspecified: Secondary | ICD-10-CM

## 2014-09-27 DIAGNOSIS — R0600 Dyspnea, unspecified: Secondary | ICD-10-CM

## 2014-09-27 DIAGNOSIS — Z8673 Personal history of transient ischemic attack (TIA), and cerebral infarction without residual deficits: Secondary | ICD-10-CM

## 2014-09-27 LAB — COMPREHENSIVE METABOLIC PANEL
ALK PHOS: 63 U/L (ref 38–126)
ALT: 15 U/L (ref 14–54)
AST: 23 U/L (ref 15–41)
Albumin: 2.8 g/dL — ABNORMAL LOW (ref 3.5–5.0)
Anion gap: 9 (ref 5–15)
BUN: 9 mg/dL (ref 6–20)
CALCIUM: 8.6 mg/dL — AB (ref 8.9–10.3)
CO2: 29 mmol/L (ref 22–32)
CREATININE: 0.8 mg/dL (ref 0.44–1.00)
Chloride: 103 mmol/L (ref 101–111)
Glucose, Bld: 108 mg/dL — ABNORMAL HIGH (ref 65–99)
Potassium: 3.1 mmol/L — ABNORMAL LOW (ref 3.5–5.1)
SODIUM: 141 mmol/L (ref 135–145)
Total Bilirubin: 1.2 mg/dL (ref 0.3–1.2)
Total Protein: 5.8 g/dL — ABNORMAL LOW (ref 6.5–8.1)

## 2014-09-27 LAB — CBC WITH DIFFERENTIAL/PLATELET
Basophils Absolute: 0 10*3/uL (ref 0.0–0.1)
Basophils Relative: 0 % (ref 0–1)
EOS ABS: 0.1 10*3/uL (ref 0.0–0.7)
EOS PCT: 2 % (ref 0–5)
HCT: 37.5 % (ref 36.0–46.0)
Hemoglobin: 11.8 g/dL — ABNORMAL LOW (ref 12.0–15.0)
LYMPHS ABS: 0.9 10*3/uL (ref 0.7–4.0)
Lymphocytes Relative: 14 % (ref 12–46)
MCH: 27.4 pg (ref 26.0–34.0)
MCHC: 31.5 g/dL (ref 30.0–36.0)
MCV: 87 fL (ref 78.0–100.0)
Monocytes Absolute: 0.8 10*3/uL (ref 0.1–1.0)
Monocytes Relative: 13 % — ABNORMAL HIGH (ref 3–12)
Neutro Abs: 4.8 10*3/uL (ref 1.7–7.7)
Neutrophils Relative %: 71 % (ref 43–77)
PLATELETS: 199 10*3/uL (ref 150–400)
RBC: 4.31 MIL/uL (ref 3.87–5.11)
RDW: 15.3 % (ref 11.5–15.5)
WBC: 6.7 10*3/uL (ref 4.0–10.5)

## 2014-09-27 LAB — TSH: TSH: 0.645 u[IU]/mL (ref 0.350–4.500)

## 2014-09-27 LAB — PROCALCITONIN: Procalcitonin: <0.1 ng/mL

## 2014-09-27 LAB — TROPONIN I: Troponin I: 0.14 ng/mL — ABNORMAL HIGH (ref <0.031)

## 2014-09-27 MED ORDER — VANCOMYCIN HCL IN DEXTROSE 750-5 MG/150ML-% IV SOLN
750.0000 mg | INTRAVENOUS | Status: DC
  Administered 2014-09-27 – 2014-09-28 (×2): 750 mg via INTRAVENOUS
  Filled 2014-09-27 (×2): qty 150

## 2014-09-27 MED ORDER — LISINOPRIL 2.5 MG PO TABS
2.5000 mg | ORAL_TABLET | Freq: Every day | ORAL | Status: DC
  Administered 2014-09-27 – 2014-09-28 (×2): 2.5 mg via ORAL
  Filled 2014-09-27 (×2): qty 1

## 2014-09-27 MED ORDER — FERROUS SULFATE 325 (65 FE) MG PO TABS
325.0000 mg | ORAL_TABLET | Freq: Every day | ORAL | Status: DC
  Administered 2014-09-27 – 2014-09-28 (×2): 325 mg via ORAL
  Filled 2014-09-27 (×2): qty 1

## 2014-09-27 MED ORDER — HYDRALAZINE HCL 25 MG PO TABS
25.0000 mg | ORAL_TABLET | Freq: Three times a day (TID) | ORAL | Status: DC
  Administered 2014-09-27 – 2014-09-28 (×2): 25 mg via ORAL
  Filled 2014-09-27 (×2): qty 1

## 2014-09-27 MED ORDER — ACETAMINOPHEN 325 MG PO TABS
650.0000 mg | ORAL_TABLET | Freq: Four times a day (QID) | ORAL | Status: DC | PRN
Start: 2014-09-27 — End: 2014-09-28
  Administered 2014-09-27: 650 mg via ORAL
  Filled 2014-09-27: qty 2

## 2014-09-27 MED ORDER — PIPERACILLIN-TAZOBACTAM 3.375 G IVPB
3.3750 g | Freq: Three times a day (TID) | INTRAVENOUS | Status: DC
  Administered 2014-09-27 – 2014-09-28 (×4): 3.375 g via INTRAVENOUS
  Filled 2014-09-27 (×6): qty 50

## 2014-09-27 MED ORDER — ONDANSETRON HCL 4 MG/2ML IJ SOLN: 4.0000 mg | Freq: Four times a day (QID) | INTRAMUSCULAR | Status: DC | PRN

## 2014-09-27 MED ORDER — SIMVASTATIN 5 MG PO TABS
10.0000 mg | ORAL_TABLET | Freq: Every day | ORAL | Status: DC
  Administered 2014-09-27 – 2014-09-28 (×2): 10 mg via ORAL
  Filled 2014-09-27 (×2): qty 2

## 2014-09-27 MED ORDER — LORATADINE 10 MG PO TABS
10.0000 mg | ORAL_TABLET | Freq: Every day | ORAL | Status: DC
  Administered 2014-09-27 – 2014-09-28 (×2): 10 mg via ORAL
  Filled 2014-09-27 (×2): qty 1

## 2014-09-27 MED ORDER — POTASSIUM CHLORIDE CRYS ER 20 MEQ PO TBCR
20.0000 meq | EXTENDED_RELEASE_TABLET | Freq: Once | ORAL | Status: AC
  Administered 2014-09-27: 20 meq via ORAL
  Filled 2014-09-27: qty 1

## 2014-09-27 MED ORDER — TETANUS-DIPHTH-ACELL PERTUSSIS 5-2.5-18.5 LF-MCG/0.5 IM SUSP
0.5000 mL | Freq: Once | INTRAMUSCULAR | Status: DC
  Filled 2014-09-27: qty 0.5

## 2014-09-27 MED ORDER — ACETAMINOPHEN 325 MG PO TABS: 650.0000 mg | ORAL_TABLET | Freq: Four times a day (QID) | ORAL | Status: DC | PRN

## 2014-09-27 MED ORDER — HYDRALAZINE HCL 20 MG/ML IJ SOLN
10.0000 mg | INTRAMUSCULAR | Status: DC | PRN
  Administered 2014-09-27: 10 mg via INTRAVENOUS
  Filled 2014-09-27: qty 1

## 2014-09-27 MED ORDER — FUROSEMIDE 10 MG/ML IJ SOLN
10.0000 mg | Freq: Once | INTRAMUSCULAR | Status: AC
  Administered 2014-09-27: 10 mg via INTRAVENOUS
  Filled 2014-09-27: qty 4

## 2014-09-27 MED ORDER — METOPROLOL TARTRATE 100 MG PO TABS
100.0000 mg | ORAL_TABLET | Freq: Two times a day (BID) | ORAL | Status: DC
  Administered 2014-09-27 – 2014-09-28 (×3): 100 mg via ORAL
  Filled 2014-09-27 (×3): qty 1

## 2014-09-27 MED ORDER — ACETAMINOPHEN 650 MG RE SUPP
650.0000 mg | Freq: Four times a day (QID) | RECTAL | Status: DC | PRN
  Filled 2014-09-27: qty 1

## 2014-09-27 MED ORDER — SODIUM CHLORIDE 0.9 % IJ SOLN
3.0000 mL | Freq: Two times a day (BID) | INTRAMUSCULAR | Status: DC
  Administered 2014-09-27: 3 mL via INTRAVENOUS

## 2014-09-27 MED ORDER — ONDANSETRON HCL 4 MG PO TABS
4.0000 mg | ORAL_TABLET | Freq: Four times a day (QID) | ORAL | Status: DC | PRN
Start: 2014-09-27 — End: 2014-09-28

## 2014-09-27 MED ORDER — SUCRALFATE 1 G PO TABS
1.0000 g | ORAL_TABLET | Freq: Three times a day (TID) | ORAL | Status: DC
  Administered 2014-09-27 – 2014-09-28 (×3): 1 g via ORAL
  Filled 2014-09-27 (×3): qty 1

## 2014-09-27 NOTE — Progress Notes (Signed)
*  PRELIMINARY RESULTS* Echocardiogram 2D Echocardiogram has been performed.  Debra York 09/27/2014, 1:50 PM

## 2014-09-27 NOTE — Progress Notes (Signed)
Patient is alert to self and place at this time, refused breakfast "I don'[t want to eat". She is willing to take her medication and refused tetnus injection at this time "I don't want no shot, don't give me no shot". Fluid offered. Will continue to monitor.   Ave Filter, RN

## 2014-09-27 NOTE — Consult Note (Signed)
Reason for Consult: Subdural hematoma Referring Physician: Internal medicine  Debra York is an 81 y.o. female.  HPI: Patient is an 81 year old female who resides in a nursing home with a baseline left hemiplegia from previous right-sided CVA. Who had a ground-level fall off of her chair hitting her head. Patient brought to the emerge from and underwent CT scan of her head which showed a subdural hematoma and patient has been admitted for observation. Currently the patient is not complaining of any headache denies any nausea or vomiting.  Past Medical History  Diagnosis Date  . Hypertension   . Constipation   . Cerebral vascular accident   . Uterine neoplasm   . Cough   . Insomnia   . Gastroesophageal reflux disease   . Allergic rhinitis   . Vitamin D deficiency     Past Surgical History  Procedure Laterality Date  . Foot surgery      Family History  Problem Relation Age of Onset  . Stroke Neg Hx   . Diabetes Mellitus II Neg Hx     Social History:  reports that she has been smoking.  She does not have any smokeless tobacco history on file. She reports that she does not drink alcohol. Her drug history is not on file.  Allergies: No Known Allergies  Medications: I have reviewed the patient's current medications.  Results for orders placed or performed during the hospital encounter of 09/26/14 (from the past 48 hour(s))  CBC with Differential/Platelet     Status: Abnormal   Collection Time: 09/26/14 10:15 PM  Result Value Ref Range   WBC 6.9 4.0 - 10.5 K/uL   RBC 4.66 3.87 - 5.11 MIL/uL   Hemoglobin 12.8 12.0 - 15.0 g/dL   HCT 40.5 36.0 - 46.0 %   MCV 86.9 78.0 - 100.0 fL   MCH 27.5 26.0 - 34.0 pg   MCHC 31.6 30.0 - 36.0 g/dL   RDW 15.2 11.5 - 15.5 %   Platelets 211 150 - 400 K/uL   Neutrophils Relative % 64 43 - 77 %   Neutro Abs 4.4 1.7 - 7.7 K/uL   Lymphocytes Relative 20 12 - 46 %   Lymphs Abs 1.3 0.7 - 4.0 K/uL   Monocytes Relative 14 (H) 3 - 12 %    Monocytes Absolute 1.0 0.1 - 1.0 K/uL   Eosinophils Relative 2 0 - 5 %   Eosinophils Absolute 0.1 0.0 - 0.7 K/uL   Basophils Relative 0 0 - 1 %   Basophils Absolute 0.0 0.0 - 0.1 K/uL  Comprehensive metabolic panel     Status: Abnormal   Collection Time: 09/26/14 10:15 PM  Result Value Ref Range   Sodium 141 135 - 145 mmol/L   Potassium 3.3 (L) 3.5 - 5.1 mmol/L   Chloride 104 101 - 111 mmol/L   CO2 30 22 - 32 mmol/L   Glucose, Bld 112 (H) 65 - 99 mg/dL   BUN 14 6 - 20 mg/dL   Creatinine, Ser 0.67 0.44 - 1.00 mg/dL   Calcium 8.8 (L) 8.9 - 10.3 mg/dL   Total Protein 6.9 6.5 - 8.1 g/dL   Albumin 3.3 (L) 3.5 - 5.0 g/dL   AST 24 15 - 41 U/L   ALT 15 14 - 54 U/L   Alkaline Phosphatase 75 38 - 126 U/L   Total Bilirubin 1.2 0.3 - 1.2 mg/dL   GFR calc non Af Amer >60 >60 mL/min   GFR calc Af Amer >60 >60  mL/min    Comment: (NOTE) The eGFR has been calculated using the CKD EPI equation. This calculation has not been validated in all clinical situations. eGFR's persistently <60 mL/min signify possible Chronic Kidney Disease.    Anion gap 7 5 - 15  Brain natriuretic peptide     Status: Abnormal   Collection Time: 09/26/14 10:16 PM  Result Value Ref Range   B Natriuretic Peptide 1808.5 (H) 0.0 - 100.0 pg/mL  I-stat chem 8, ed     Status: Abnormal   Collection Time: 09/26/14 10:21 PM  Result Value Ref Range   Sodium 142 135 - 145 mmol/L   Potassium 3.3 (L) 3.5 - 5.1 mmol/L   Chloride 99 (L) 101 - 111 mmol/L   BUN 14 6 - 20 mg/dL   Creatinine, Ser 0.80 0.44 - 1.00 mg/dL   Glucose, Bld 111 (H) 65 - 99 mg/dL   Calcium, Ion 1.13 1.13 - 1.30 mmol/L   TCO2 29 0 - 100 mmol/L   Hemoglobin 15.0 12.0 - 15.0 g/dL   HCT 44.0 36.0 - 46.0 %  I-Stat CG4 Lactic Acid, ED     Status: None   Collection Time: 09/26/14 10:22 PM  Result Value Ref Range   Lactic Acid, Venous 1.14 0.5 - 2.0 mmol/L  Troponin I     Status: Abnormal   Collection Time: 09/27/14  1:54 AM  Result Value Ref Range    Troponin I 0.14 (H) <0.031 ng/mL    Comment:        PERSISTENTLY INCREASED TROPONIN VALUES IN THE RANGE OF 0.04-0.49 ng/mL CAN BE SEEN IN:       -UNSTABLE ANGINA       -CONGESTIVE HEART FAILURE       -MYOCARDITIS       -CHEST TRAUMA       -ARRYHTHMIAS       -LATE PRESENTING MYOCARDIAL INFARCTION       -COPD   CLINICAL FOLLOW-UP RECOMMENDED.   Comprehensive metabolic panel     Status: Abnormal   Collection Time: 09/27/14  1:54 AM  Result Value Ref Range   Sodium 141 135 - 145 mmol/L   Potassium 3.1 (L) 3.5 - 5.1 mmol/L   Chloride 103 101 - 111 mmol/L   CO2 29 22 - 32 mmol/L   Glucose, Bld 108 (H) 65 - 99 mg/dL   BUN 9 6 - 20 mg/dL   Creatinine, Ser 0.80 0.44 - 1.00 mg/dL   Calcium 8.6 (L) 8.9 - 10.3 mg/dL   Total Protein 5.8 (L) 6.5 - 8.1 g/dL   Albumin 2.8 (L) 3.5 - 5.0 g/dL   AST 23 15 - 41 U/L   ALT 15 14 - 54 U/L   Alkaline Phosphatase 63 38 - 126 U/L   Total Bilirubin 1.2 0.3 - 1.2 mg/dL   GFR calc non Af Amer >60 >60 mL/min   GFR calc Af Amer >60 >60 mL/min    Comment: (NOTE) The eGFR has been calculated using the CKD EPI equation. This calculation has not been validated in all clinical situations. eGFR's persistently <60 mL/min signify possible Chronic Kidney Disease.    Anion gap 9 5 - 15  TSH     Status: None   Collection Time: 09/27/14  1:54 AM  Result Value Ref Range   TSH 0.645 0.350 - 4.500 uIU/mL  CBC WITH DIFFERENTIAL     Status: Abnormal   Collection Time: 09/27/14  1:54 AM  Result Value Ref Range   WBC  6.7 4.0 - 10.5 K/uL   RBC 4.31 3.87 - 5.11 MIL/uL   Hemoglobin 11.8 (L) 12.0 - 15.0 g/dL   HCT 37.5 36.0 - 46.0 %   MCV 87.0 78.0 - 100.0 fL   MCH 27.4 26.0 - 34.0 pg   MCHC 31.5 30.0 - 36.0 g/dL   RDW 15.3 11.5 - 15.5 %   Platelets 199 150 - 400 K/uL   Neutrophils Relative % 71 43 - 77 %   Neutro Abs 4.8 1.7 - 7.7 K/uL   Lymphocytes Relative 14 12 - 46 %   Lymphs Abs 0.9 0.7 - 4.0 K/uL   Monocytes Relative 13 (H) 3 - 12 %   Monocytes  Absolute 0.8 0.1 - 1.0 K/uL   Eosinophils Relative 2 0 - 5 %   Eosinophils Absolute 0.1 0.0 - 0.7 K/uL   Basophils Relative 0 0 - 1 %   Basophils Absolute 0.0 0.0 - 0.1 K/uL  Procalcitonin - Baseline     Status: None   Collection Time: 09/27/14  1:54 AM  Result Value Ref Range   Procalcitonin <0.10 ng/mL    Comment:        Interpretation: PCT (Procalcitonin) <= 0.5 ng/mL: Systemic infection (sepsis) is not likely. Local bacterial infection is possible. (NOTE)         ICU PCT Algorithm               Non ICU PCT Algorithm    ----------------------------     ------------------------------         PCT < 0.25 ng/mL                 PCT < 0.1 ng/mL     Stopping of antibiotics            Stopping of antibiotics       strongly encouraged.               strongly encouraged.    ----------------------------     ------------------------------       PCT level decrease by               PCT < 0.25 ng/mL       >= 80% from peak PCT       OR PCT 0.25 - 0.5 ng/mL          Stopping of antibiotics                                             encouraged.     Stopping of antibiotics           encouraged.    ----------------------------     ------------------------------       PCT level decrease by              PCT >= 0.25 ng/mL       < 80% from peak PCT        AND PCT >= 0.5 ng/mL            Continuin g antibiotics                                              encouraged.       Continuing antibiotics  encouraged.    ----------------------------     ------------------------------     PCT level increase compared          PCT > 0.5 ng/mL         with peak PCT AND          PCT >= 0.5 ng/mL             Escalation of antibiotics                                          strongly encouraged.      Escalation of antibiotics        strongly encouraged.     Ct Head Wo Contrast  09/26/2014   CLINICAL DATA:  Unwitnessed fall. Down time unknown. Laceration to the left side of forehead. Patient fell  out of wheelchair.  EXAM: CT HEAD WITHOUT CONTRAST  CT CERVICAL SPINE WITHOUT CONTRAST  TECHNIQUE: Multidetector CT imaging of the head and cervical spine was performed following the standard protocol without intravenous contrast. Multiplanar CT image reconstructions of the cervical spine were also generated.  COMPARISON:  None.  FINDINGS: CT HEAD FINDINGS  Postoperative changes with right temporal craniotomy and surgical clip in the right middle cranial fossa consistent with aneurysm clip. Encephalomalacia in the right frontotemporal and parietal lobe. Prominent ventricular dilatation consistent with volume loss. Diffuse cerebral atrophy.  There is focal acute subdural hematoma along the right frontoparietal region. Maximal depth is measured at about 1 cm although most of the subdural hematoma is not that large. There is no significant mass effect or midline shift. No sub arachnoid, intraventricular, or intraparenchymal hemorrhage. No depressed skull fractures. Retention cysts in the right maxillary antrum and left sphenoid sinus. Mastoid air cells are not opacified. Vascular calcifications.  CT CERVICAL SPINE FINDINGS  There is reversal of the usual cervical lordosis, likely degenerative. No anterior subluxation. Normal alignment of facet joints. Coalition of the posterior elements at C3-4 bilaterally. No vertebral compression deformities. No prevertebral soft tissue swelling. Diffuse degenerative change throughout the cervical spine with narrowed cervical interspaces and endplate hypertrophic changes throughout. Degenerative changes throughout the facet joints. C1-2 articulation appears intact. Extensive vascular calcifications in the cervical carotid vessels. This likely results in severe calcific stenosis of both common carotid arteries. Mid bilateral pleural effusions, greater on the left. Emphysematous changes and fibrosis in the lung apices. Visualized esophagus appears dilated with an air-fluid level. This  may be due dysmotility or distal esophageal narrowing.  IMPRESSION: Acute right subdural hematoma with maximal depth of 1 cm. No mass effect or midline shift. Postoperative changes consistent with aneurysm clipping on the right with chronic encephalomalacia and cerebral atrophy.  Diffuse degenerative changes throughout the cervical spine likely accounts for reversal of cervical lordosis. No acute displaced fractures identified.  These results were called by telephone at the time of interpretation on 09/26/2014 at 8:51 pm to Dr. Milton Ferguson , who verbally acknowledged these results.   Electronically Signed   By: Lucienne Capers M.D.   On: 09/26/2014 20:59   Ct Cervical Spine Wo Contrast  09/26/2014   CLINICAL DATA:  Unwitnessed fall. Down time unknown. Laceration to the left side of forehead. Patient fell out of wheelchair.  EXAM: CT HEAD WITHOUT CONTRAST  CT CERVICAL SPINE WITHOUT CONTRAST  TECHNIQUE: Multidetector CT imaging of the head and cervical spine was performed following the standard protocol  without intravenous contrast. Multiplanar CT image reconstructions of the cervical spine were also generated.  COMPARISON:  None.  FINDINGS: CT HEAD FINDINGS  Postoperative changes with right temporal craniotomy and surgical clip in the right middle cranial fossa consistent with aneurysm clip. Encephalomalacia in the right frontotemporal and parietal lobe. Prominent ventricular dilatation consistent with volume loss. Diffuse cerebral atrophy.  There is focal acute subdural hematoma along the right frontoparietal region. Maximal depth is measured at about 1 cm although most of the subdural hematoma is not that large. There is no significant mass effect or midline shift. No sub arachnoid, intraventricular, or intraparenchymal hemorrhage. No depressed skull fractures. Retention cysts in the right maxillary antrum and left sphenoid sinus. Mastoid air cells are not opacified. Vascular calcifications.  CT CERVICAL SPINE  FINDINGS  There is reversal of the usual cervical lordosis, likely degenerative. No anterior subluxation. Normal alignment of facet joints. Coalition of the posterior elements at C3-4 bilaterally. No vertebral compression deformities. No prevertebral soft tissue swelling. Diffuse degenerative change throughout the cervical spine with narrowed cervical interspaces and endplate hypertrophic changes throughout. Degenerative changes throughout the facet joints. C1-2 articulation appears intact. Extensive vascular calcifications in the cervical carotid vessels. This likely results in severe calcific stenosis of both common carotid arteries. Mid bilateral pleural effusions, greater on the left. Emphysematous changes and fibrosis in the lung apices. Visualized esophagus appears dilated with an air-fluid level. This may be due dysmotility or distal esophageal narrowing.  IMPRESSION: Acute right subdural hematoma with maximal depth of 1 cm. No mass effect or midline shift. Postoperative changes consistent with aneurysm clipping on the right with chronic encephalomalacia and cerebral atrophy.  Diffuse degenerative changes throughout the cervical spine likely accounts for reversal of cervical lordosis. No acute displaced fractures identified.  These results were called by telephone at the time of interpretation on 09/26/2014 at 8:51 pm to Dr. Milton Ferguson , who verbally acknowledged these results.   Electronically Signed   By: Lucienne Capers M.D.   On: 09/26/2014 20:59   Dg Pelvis Portable  09/26/2014   CLINICAL DATA:  [address] out of wheelchair, unwitnessed fall at nursing home  EXAM: PORTABLE PELVIS 1-2 VIEWS  COMPARISON:  07/02/2006  FINDINGS: Osseous demineralization.  Hip and SI joints grossly symmetric and preserved.  Orthopedic hardware proximal LEFT femur post ORIF of an intertrochanteric fracture.  No definite acute fracture, dislocation, or bone destruction.  Soft tissue swelling laterally at LEFT hip region.   Extensive atherosclerotic calcifications.  IMPRESSION: Osseous demineralization with prior ORIF of the proximal LEFT femur.  No definite acute bony abnormalities.   Electronically Signed   By: Lavonia Dana M.D.   On: 09/26/2014 21:56   Dg Chest Port 1 View  09/26/2014   CLINICAL DATA:  Shortness of breath.  EXAM: PORTABLE CHEST - 1 VIEW  COMPARISON:  07/02/2006  FINDINGS: Mild cardiac enlargement noted. Diffuse bilateral hazy lung opacities are identified compatible with pulmonary edema. A more focal opacity is noted within the right midlung. The visualized osseous structures appear intact.  IMPRESSION: 1. Cardiac enlargement and pulmonary edema. 2. Focal opacity within the right midlung may represent superimposed pneumonia. Followup PA and lateral chest X-ray is recommended in 3-4 weeks following trial of antibiotic therapy to ensure resolution and exclude underlying malignancy.   Electronically Signed   By: Kerby Moors M.D.   On: 09/26/2014 21:57    Review of Systems  Constitutional: Negative.   HENT: Negative.   Eyes: Negative.   Respiratory: Negative.  Cardiovascular: Negative.   Gastrointestinal: Negative.   Genitourinary: Negative.   Musculoskeletal: Negative.   Skin: Negative.   Neurological: Negative.   Endo/Heme/Allergies: Negative.   Psychiatric/Behavioral: Negative.    Blood pressure 127/60, pulse 51, temperature 98.6 F (37 C), temperature source Axillary, resp. rate 18, weight 48.444 kg (106 lb 12.8 oz), SpO2 100 %. Physical Exam  Constitutional: She is oriented to person, place, and time. She appears well-developed and well-nourished.  HENT:  Head: Normocephalic.  Eyes: Pupils are equal, round, and reactive to light.  Neck: Normal range of motion.  Cardiovascular: Normal rate.   Respiratory: Effort normal.  GI: Soft.  Musculoskeletal: Normal range of motion.  Neurological: She is alert and oriented to person, place, and time. She has normal strength.  Patient is  awake and alert extraocular movements are intact right-sided strength is 5 out of 5 right sided hemiparesis from previous CVA.  Skin: Skin is warm and dry.    Assessment/Plan: 81 year old female with a very small convexity acute on subacute subdural hematoma overlying the area of her brain where she had the previous CVA. Currently no mass effect recommend a repeat head CT otherwise if this is stable and the patient is medically stable she can be transferred back to the nursing home. No new neurosurgical recommendations.  Herschel Fleagle P 09/27/2014, 7:40 AM

## 2014-09-27 NOTE — Progress Notes (Signed)
PT Cancellation Note  Patient Details Name: Debra York MRN: [id number] DOB: 24-Jul-1924   Cancelled Treatment:    Reason Eval/Treat Not Completed: Medical issues which prohibited therapy Pt with elevated troponin this AM. Will hold PT evaluation until cleared by MD/cardiologist.    Lacie Draft 09/27/2014, 9:03 AM Wray Kearns, PT, DPT [phone]

## 2014-09-27 NOTE — H&P (Signed)
[facility] History and Physical  Debra York IOE:[id number] DOB: [birth year] DOA: 09/26/2014  Referring physician: Dr. Eddie Dibbles. PCP: No primary care provider on file. patient lives in a nursing home. Specialists: None.  Chief Complaint: Fall.  HPI: Debra York is a 81 y.o. female history of previous stroke with left-sided hemiparesis, cerebral aneurysm clipping in the 1970s and patient will not be a candidate for MRI, hypertension was brought to the ER the patient had a fall at her nursing home. Patient states she was in her wheelchair when suddenly she lost balance and fell forward. Patient states she she to head but did not lose consciousness. In the ER CT head shows acute right subdural hematoma with no midline shift. On-call neurosurgeon Dr. Saintclair Halsted was consulted by the ER physician and Dr. Saintclair Halsted at this time as advised observation and repeat CT scan in a.m. Neurosurgery will be seeing patient in consult. In the ER patient was found to be hypoxic requiring oxygen. Chest x-ray shows congestion and possible opacity. Patient states she has chronic shortness of breath that she smokes cigarettes. Denies any productive cough chest pain fever chills abdominal pain nausea vomiting or diarrhea.  Review of Systems: As presented in the history of presenting illness, rest negative.  Past Medical History  Diagnosis Date  . Hypertension   . Constipation   . Cerebral vascular accident   . Uterine neoplasm   . Cough   . Insomnia   . Gastroesophageal reflux disease   . Allergic rhinitis   . Vitamin D deficiency    Past Surgical History  Procedure Laterality Date  . Foot surgery     Social History:  reports that she has been smoking.  She does not have any smokeless tobacco history on file. She reports that she does not drink alcohol. Her drug history is not on file. Where does patient live nursing home. Can patient participate in ADLs? Yes.  No Known Allergies  Family History:   Family History  Problem Relation Age of Onset  . Stroke Neg Hx   . Diabetes Mellitus II Neg Hx       Prior to Admission medications   Medication Sig Start Date End Date Taking? Authorizing Provider  acetaminophen (TYLENOL) 325 MG tablet Take 650 mg by mouth every 6 (six) hours as needed for mild pain, fever or headache.   Yes Historical Provider, MD  aspirin 81 MG chewable tablet Chew 81 mg by mouth daily.   Yes Historical Provider, MD  cetirizine (ZYRTEC) 10 MG tablet Take 10 mg by mouth daily.   Yes Historical Provider, MD  cholecalciferol (VITAMIN D) 400 UNITS TABS tablet Take 800 Units by mouth.   Yes Historical Provider, MD  ferrous sulfate 325 (65 FE) MG tablet Take 325 mg by mouth daily with breakfast.   Yes Historical Provider, MD  hydrALAZINE (APRESOLINE) 25 MG tablet Take 25 mg by mouth 3 (three) times daily.   Yes Historical Provider, MD  hydrocerin (EUCERIN) CREA Apply 1 application topically 2 (two) times daily. To legs and feet.   Yes Historical Provider, MD  metoprolol (LOPRESSOR) 100 MG tablet Take 100 mg by mouth 2 (two) times daily.   Yes Historical Provider, MD  simvastatin (ZOCOR) 10 MG tablet Take 10 mg by mouth daily.   Yes Historical Provider, MD  sucralfate (CARAFATE) 1 G tablet Take 1 g by mouth 4 (four) times daily -  with meals and at bedtime.   Yes Historical Provider, MD    Physical Exam:  Filed Vitals:   09/26/14 2300 09/26/14 2324 09/26/14 2342 09/27/14 0000  BP: 183/120 191/98 193/80 161/81  Pulse: 62 63 62 63  Temp:      TempSrc:      Resp: 20 14 26 22   SpO2: 87% 92% 91% 93%     General:  Moderately built and poorly nourished.  Eyes: Anicteric no pallor.  ENT: No discharge from the ears eyes nose and mouth.  Neck: No mass felt. Mild JVD elevation.  Cardiovascular: S1 and S2 heard.  Respiratory: No rhonchi or crepitations.  Abdomen: Soft nontender bowel sounds present.  Skin: Small skin laceration on the forehead.  Musculoskeletal: No  edema.  Psychiatric: Appears normal.  Neurologic: Alert awake oriented to time place and person. Has left-sided hemiparesis from previous stroke with left facial droop.  Labs on Admission:  Basic Metabolic Panel:  Recent Labs Lab 09/26/14 2215 09/26/14 2221  NA 141 142  K 3.3* 3.3*  CL 104 99*  CO2 30  --   GLUCOSE 112* 111*  BUN 14 14  CREATININE 0.67 0.80  CALCIUM 8.8*  --    Liver Function Tests:  Recent Labs Lab 09/26/14 2215  AST 24  ALT 15  ALKPHOS 75  BILITOT 1.2  PROT 6.9  ALBUMIN 3.3*   No results for input(s): LIPASE, AMYLASE in the last 168 hours. No results for input(s): AMMONIA in the last 168 hours. CBC:  Recent Labs Lab 09/26/14 2215 09/26/14 2221  WBC 6.9  --   NEUTROABS 4.4  --   HGB 12.8 15.0  HCT 40.5 44.0  MCV 86.9  --   PLT 211  --    Cardiac Enzymes: No results for input(s): CKTOTAL, CKMB, CKMBINDEX, TROPONINI in the last 168 hours.  BNP (last 3 results)  Recent Labs  09/26/14 2216  BNP 1808.5*    ProBNP (last 3 results) No results for input(s): PROBNP in the last 8760 hours.  CBG: No results for input(s): GLUCAP in the last 168 hours.  Radiological Exams on Admission: Ct Head Wo Contrast  09/26/2014   CLINICAL DATA:  Unwitnessed fall. Down time unknown. Laceration to the left side of forehead. Patient fell out of wheelchair.  EXAM: CT HEAD WITHOUT CONTRAST  CT CERVICAL SPINE WITHOUT CONTRAST  TECHNIQUE: Multidetector CT imaging of the head and cervical spine was performed following the standard protocol without intravenous contrast. Multiplanar CT image reconstructions of the cervical spine were also generated.  COMPARISON:  None.  FINDINGS: CT HEAD FINDINGS  Postoperative changes with right temporal craniotomy and surgical clip in the right middle cranial fossa consistent with aneurysm clip. Encephalomalacia in the right frontotemporal and parietal lobe. Prominent ventricular dilatation consistent with volume loss. Diffuse  cerebral atrophy.  There is focal acute subdural hematoma along the right frontoparietal region. Maximal depth is measured at about 1 cm although most of the subdural hematoma is not that large. There is no significant mass effect or midline shift. No sub arachnoid, intraventricular, or intraparenchymal hemorrhage. No depressed skull fractures. Retention cysts in the right maxillary antrum and left sphenoid sinus. Mastoid air cells are not opacified. Vascular calcifications.  CT CERVICAL SPINE FINDINGS  There is reversal of the usual cervical lordosis, likely degenerative. No anterior subluxation. Normal alignment of facet joints. Coalition of the posterior elements at C3-4 bilaterally. No vertebral compression deformities. No prevertebral soft tissue swelling. Diffuse degenerative change throughout the cervical spine with narrowed cervical interspaces and endplate hypertrophic changes throughout. Degenerative changes throughout the facet joints.  C1-2 articulation appears intact. Extensive vascular calcifications in the cervical carotid vessels. This likely results in severe calcific stenosis of both common carotid arteries. Mid bilateral pleural effusions, greater on the left. Emphysematous changes and fibrosis in the lung apices. Visualized esophagus appears dilated with an air-fluid level. This may be due dysmotility or distal esophageal narrowing.  IMPRESSION: Acute right subdural hematoma with maximal depth of 1 cm. No mass effect or midline shift. Postoperative changes consistent with aneurysm clipping on the right with chronic encephalomalacia and cerebral atrophy.  Diffuse degenerative changes throughout the cervical spine likely accounts for reversal of cervical lordosis. No acute displaced fractures identified.  These results were called by telephone at the time of interpretation on 09/26/2014 at 8:51 pm to Dr. Milton Ferguson , who verbally acknowledged these results.   Electronically Signed   By: Lucienne Capers M.D.   On: 09/26/2014 20:59   Ct Cervical Spine Wo Contrast  09/26/2014   CLINICAL DATA:  Unwitnessed fall. Down time unknown. Laceration to the left side of forehead. Patient fell out of wheelchair.  EXAM: CT HEAD WITHOUT CONTRAST  CT CERVICAL SPINE WITHOUT CONTRAST  TECHNIQUE: Multidetector CT imaging of the head and cervical spine was performed following the standard protocol without intravenous contrast. Multiplanar CT image reconstructions of the cervical spine were also generated.  COMPARISON:  None.  FINDINGS: CT HEAD FINDINGS  Postoperative changes with right temporal craniotomy and surgical clip in the right middle cranial fossa consistent with aneurysm clip. Encephalomalacia in the right frontotemporal and parietal lobe. Prominent ventricular dilatation consistent with volume loss. Diffuse cerebral atrophy.  There is focal acute subdural hematoma along the right frontoparietal region. Maximal depth is measured at about 1 cm although most of the subdural hematoma is not that large. There is no significant mass effect or midline shift. No sub arachnoid, intraventricular, or intraparenchymal hemorrhage. No depressed skull fractures. Retention cysts in the right maxillary antrum and left sphenoid sinus. Mastoid air cells are not opacified. Vascular calcifications.  CT CERVICAL SPINE FINDINGS  There is reversal of the usual cervical lordosis, likely degenerative. No anterior subluxation. Normal alignment of facet joints. Coalition of the posterior elements at C3-4 bilaterally. No vertebral compression deformities. No prevertebral soft tissue swelling. Diffuse degenerative change throughout the cervical spine with narrowed cervical interspaces and endplate hypertrophic changes throughout. Degenerative changes throughout the facet joints. C1-2 articulation appears intact. Extensive vascular calcifications in the cervical carotid vessels. This likely results in severe calcific stenosis of both common  carotid arteries. Mid bilateral pleural effusions, greater on the left. Emphysematous changes and fibrosis in the lung apices. Visualized esophagus appears dilated with an air-fluid level. This may be due dysmotility or distal esophageal narrowing.  IMPRESSION: Acute right subdural hematoma with maximal depth of 1 cm. No mass effect or midline shift. Postoperative changes consistent with aneurysm clipping on the right with chronic encephalomalacia and cerebral atrophy.  Diffuse degenerative changes throughout the cervical spine likely accounts for reversal of cervical lordosis. No acute displaced fractures identified.  These results were called by telephone at the time of interpretation on 09/26/2014 at 8:51 pm to Dr. Milton Ferguson , who verbally acknowledged these results.   Electronically Signed   By: Lucienne Capers M.D.   On: 09/26/2014 20:59   Dg Pelvis Portable  09/26/2014   CLINICAL DATA:  [address] out of wheelchair, unwitnessed fall at nursing home  EXAM: PORTABLE PELVIS 1-2 VIEWS  COMPARISON:  07/02/2006  FINDINGS: Osseous demineralization.  Hip and SI  joints grossly symmetric and preserved.  Orthopedic hardware proximal LEFT femur post ORIF of an intertrochanteric fracture.  No definite acute fracture, dislocation, or bone destruction.  Soft tissue swelling laterally at LEFT hip region.  Extensive atherosclerotic calcifications.  IMPRESSION: Osseous demineralization with prior ORIF of the proximal LEFT femur.  No definite acute bony abnormalities.   Electronically Signed   By: Lavonia Dana M.D.   On: 09/26/2014 21:56   Dg Chest Port 1 View  09/26/2014   CLINICAL DATA:  Shortness of breath.  EXAM: PORTABLE CHEST - 1 VIEW  COMPARISON:  07/02/2006  FINDINGS: Mild cardiac enlargement noted. Diffuse bilateral hazy lung opacities are identified compatible with pulmonary edema. A more focal opacity is noted within the right midlung. The visualized osseous structures appear intact.  IMPRESSION: 1. Cardiac  enlargement and pulmonary edema. 2. Focal opacity within the right midlung may represent superimposed pneumonia. Followup PA and lateral chest X-ray is recommended in 3-4 weeks following trial of antibiotic therapy to ensure resolution and exclude underlying malignancy.   Electronically Signed   By: Kerby Moors M.D.   On: 09/26/2014 21:57    EKG: Independently reviewed. Normal sinus rhythm with LVH and nonspecific T-wave changes.  Assessment/Plan Principal Problem:   Subdural hematoma Active Problems:   CHF (congestive heart failure)   Hypertension   History of stroke   Hyperlipidemia   1. Acute subdural hematoma status post mechanical fall - neurosurgeon Dr. Saintclair Halsted was notified by the ER physician and will be seeing patient in consult. Repeat CT scan of the head in a.m. to assess stability. 2. CHF unspecified EF - patient looks short of breath and is hypoxic. Patient's symptoms are consistent with CHF. Last EF measured in 2003 was normal. At this time I have ordered Lasix 10 mg IV and based on the response and we will have further doses. Check 2-D echo. Cycle cardiac markers. Closely follow intake output and metabolic panel. Daily weights. 3. Right lung opacity - since patient has cigarette smoking history will need close follow-up. For now I have ordered antibiotics for possible healthcare associated pneumonia since patient states he had some cold-like symptoms over the last 1 week. If procalcitonin is negative then may discontinue antibiotics. 4. Hypertension uncontrolled - continue home medications and I have placed patient on when necessary IV hydralazine. Closely follow blood pressure trends. 5. History of stroke with left-sided hemiparesis - holding of aspirin secondary to acute subdural hematoma. 6. Hyperlipidemia on statins. 7. History of anemia on iron pills. 8. Mild hypokalemia - I have ordered potassium replacement. Follow metabolic panel.  I have reviewed patient's old charts  and labs. Personally reviewed patient's chest x-ray and EKG. Patient to be transferred to [facility] for further evaluation by neurosurgery. Dr. Alcario Drought will be the accepting physician. Patient is agreeable to transfer.   DVT Prophylaxis SCDs. (Patient has acute subdural hematoma also avoid Lovenox).  Code Status: DO NOT RESUSCITATE as confirmed by patient.  Family Communication: Discussed with patient.  Disposition Plan: Admit to inpatient.    Roxie Kreeger N. [facility] Pager [phone].  If 7PM-7AM, please contact night-coverage [email or web address] Password [facility] 09/27/2014, 12:43 AM

## 2014-09-27 NOTE — Progress Notes (Signed)
Patient admitted after midnight.  Chart reviewed. Patient examined. CT brain stable. Will discharge back to SNF in am.  Doree Barthel, MD [facility]

## 2014-09-27 NOTE — Progress Notes (Signed)
Utilization review completed. Tyrell Seifer, RN, BSN. 

## 2014-09-27 NOTE — Progress Notes (Signed)
ANTIBIOTIC CONSULT NOTE - INITIAL  Pharmacy Consult for Vancomycin/Zosyn  Indication: rule out pneumonia  No Known Allergies  Patient Measurements: Weight: 106 lb 12.8 oz (48.444 kg)  Vital Signs: Temp: 98.6 F (37 C) (08/23 0549) Temp Source: Axillary (08/23 0549) BP: 127/60 mmHg (08/23 0549) Pulse Rate: 51 (08/23 0549)  Labs:  Recent Labs  09/26/14 2215 09/26/14 2221 09/27/14 0154  WBC 6.9  --  6.7  HGB 12.8 15.0 11.8*  PLT 211  --  199  CREATININE 0.67 0.80 0.80   Medical History: Past Medical History  Diagnosis Date  . Hypertension   . Constipation   . Cerebral vascular accident   . Uterine neoplasm   . Cough   . Insomnia   . Gastroesophageal reflux disease   . Allergic rhinitis   . Vitamin D deficiency      Assessment: 81 y/o F here with SDH s/p fall, found to have possible PNA, WBC WNL, renal function appears age appropriate, other labs reviewed.   Goal of Therapy:  Vancomycin trough level 15-20 mcg/ml  Plan:  -Vancomycin 750 mg IV q24h -Zosyn 3.375G IV q8h to be infused over 4 hours -Trend WBC, temp, renal function -Drug levels as indicated   Narda Bonds 09/27/2014,6:05 AM

## 2014-09-28 DIAGNOSIS — E876 Hypokalemia: Secondary | ICD-10-CM | POA: Diagnosis present

## 2014-09-28 DIAGNOSIS — J189 Pneumonia, unspecified organism: Secondary | ICD-10-CM | POA: Diagnosis present

## 2014-09-28 DIAGNOSIS — S0081XA Abrasion of other part of head, initial encounter: Secondary | ICD-10-CM | POA: Diagnosis present

## 2014-09-28 LAB — BASIC METABOLIC PANEL
Anion gap: 12 (ref 5–15)
BUN: 12 mg/dL (ref 6–20)
CALCIUM: 8.6 mg/dL — AB (ref 8.9–10.3)
CO2: 31 mmol/L (ref 22–32)
CREATININE: 1 mg/dL (ref 0.44–1.00)
Chloride: 97 mmol/L — ABNORMAL LOW (ref 101–111)
GFR, EST AFRICAN AMERICAN: 57 mL/min — AB (ref >60)
GFR, EST NON AFRICAN AMERICAN: 49 mL/min — AB (ref >60)
Glucose, Bld: 75 mg/dL (ref 65–99)
Potassium: 3.3 mmol/L — ABNORMAL LOW (ref 3.5–5.1)
SODIUM: 140 mmol/L (ref 135–145)

## 2014-09-28 MED ORDER — MOXIFLOXACIN HCL 400 MG PO TABS: 400.0000 mg | ORAL_TABLET | Freq: Every day | ORAL | Status: DC

## 2014-09-28 MED ORDER — POTASSIUM CHLORIDE CRYS ER 20 MEQ PO TBCR
40.0000 meq | EXTENDED_RELEASE_TABLET | Freq: Once | ORAL | Status: AC
  Administered 2014-09-28: 40 meq via ORAL
  Filled 2014-09-28: qty 2

## 2014-09-28 NOTE — Discharge Summary (Addendum)
Physician Discharge Summary  Debra York NMM:[id number] DOB: 1924/12/20 DOA: 09/26/2014  PCP: No primary care provider on file.  Admit date: 09/26/2014 Discharge date: 09/28/2014  Time spent: greater than 30 minutes  Recommendations for Outpatient Follow-up:  1. Back to ALF 2. Repeat CXR 4-6 weeks ensure resolution of right sided infiltrate 3. Monitor potassium  Discharge Diagnoses:  Principal Problem:   Subdural hematoma Active Problems:   Hypertension   History of stroke   Hyperlipidemia   Hypokalemia   possible Pneumonia   Forehead abrasion  Discharge Condition: Stable  Diet recommendation: Heart healthy  Filed Weights   09/27/14 0500 09/28/14 0600  Weight: 48.444 kg (106 lb 12.8 oz) 49.261 kg (108 lb 9.6 oz)    History of present illness:  81 y.o. female history of previous stroke with left-sided hemiparesis, cerebral aneurysm clipping in the 1970s and patient will not be a candidate for MRI, hypertension was brought to the ER the patient had a fall at her nursing home. Patient states she was in her wheelchair when suddenly she lost balance and fell forward. Patient states she she to head but did not lose consciousness. In the ER CT head shows acute right subdural hematoma with no midline shift. On-call neurosurgeon Dr. Saintclair Halsted was consulted by the ER physician and Dr. Saintclair Halsted at this time as advised observation and repeat CT scan in a.m. Neurosurgery will be seeing patient in consult. In the ER patient was found to be hypoxic requiring oxygen. Chest x-ray shows congestion and possible opacity. Patient states she has chronic shortness of breath that she smokes cigarettes  Hospital Course:  Patient was admitted to the hospitalist service. Neurosurgery consulted. Neurosurgery recommended nonoperative management and just observation. Repeat CT scan of the brain normal. Initially, some concern of CHF, but I doubt this. Patient was given a dose of Lasix in the emergency room. Had  elevated troponin, but EKG normal and no chest pain. No evidence of acute coronary syndrome. Echocardiogram shows normal ejection fraction. See below. Patient has had a bit of a cough and has a right-sided infiltrate on chest x-ray. Received antibiotics while here for possible pneumonia. Will continue to complete a seven-day course. Will need repeat chest x-ray in 4-6 weeks to ensure resolution. Oxygen saturation is apparently initially were low in the emergency room, now normal. She has been afebrile. She has worked with physical therapy and is felt to be back to her baseline. She is stable for discharge.  Procedures:  none  Consultations:  Neurosurgery  Discharge Exam: Filed Vitals:   09/28/14 1015  BP: 189/57  Pulse: 71  Temp: 98.2 F (36.8 C)  Resp: 16    General: In chair. Alert oriented and comfortable. Cardiovascular: Regular rate rhythm without murmurs gallops rubs Respiratory: Clear to auscultation bilaterally without wheezes rhonchi or rales Neurologic: Old left-sided weakness  Discharge Instructions   Discharge Instructions    Diet - low sodium heart healthy    Complete by:  As directed      Walk with assistance    Complete by:  As directed           Current Discharge Medication List    START taking these medications   Details  moxifloxacin (AVELOX) 400 MG tablet Take 1 tablet (400 mg total) by mouth daily at 8 pm. Through 10/03/14 Qty: 5 tablet, Refills: 0      CONTINUE these medications which have NOT CHANGED   Details  acetaminophen (TYLENOL) 325 MG tablet Take 650 mg by  mouth every 6 (six) hours as needed for mild pain, fever or headache.    cetirizine (ZYRTEC) 10 MG tablet Take 10 mg by mouth daily.    cholecalciferol (VITAMIN D) 400 UNITS TABS tablet Take 800 Units by mouth.    ferrous sulfate 325 (65 FE) MG tablet Take 325 mg by mouth daily with breakfast.    hydrALAZINE (APRESOLINE) 25 MG tablet Take 25 mg by mouth 3 (three) times daily.     hydrocerin (EUCERIN) CREA Apply 1 application topically 2 (two) times daily. To legs and feet.    metoprolol (LOPRESSOR) 100 MG tablet Take 100 mg by mouth 2 (two) times daily.    simvastatin (ZOCOR) 10 MG tablet Take 10 mg by mouth daily.    sucralfate (CARAFATE) 1 G tablet Take 1 g by mouth 4 (four) times daily -  with meals and at bedtime.      STOP taking these medications     aspirin 81 MG chewable tablet        No Known Allergies    The results of significant diagnostics from this hospitalization (including imaging, microbiology, ancillary and laboratory) are listed below for reference.    Significant Diagnostic Studies: Ct Head Wo Contrast  09/27/2014   CLINICAL DATA:  81 year old female with subdural hematoma discovered after unwitnessed fall. Initial encounter.  EXAM: CT HEAD WITHOUT CONTRAST  TECHNIQUE: Contiguous axial images were obtained from the base of the skull through the vertex without intravenous contrast.  COMPARISON:  09/26/2014  FINDINGS: Motion artifact at the skullbase. Sequelae of right frontotemporal craniotomy and right ICA terminus region aneurysm clipping. Stable visualized paranasal sinuses and mastoids. No acute osseous abnormality identified.  No acute orbit or scalp soft tissue finding identified.  Hyperdense mildly lobulated right side subdural hematoma re- demonstrated, measuring up to 6 mm in thickness. This appears decreased since yesterday, and is less apparent at the vertex on today images.  No midline shift, with chronic extensive encephalomalacia in the right hemisphere. Ex vacuo ventricle enlargement is stable. Basilar cisterns appear stable. No new intracranial hemorrhage identified. No acute cortically based infarct identified.  IMPRESSION: 1. Regressed right side subdural hematoma since yesterday, residual up to 6 mm in thickness. 2. No significant intracranial mass effect in the setting of extensive chronic right hemisphere encephalomalacia.  Previous right ICA terminus aneurysm clipping. 3. No new intracranial abnormality.   Electronically Signed   By: Genevie Ann M.D.   On: 09/27/2014 15:34   Ct Head Wo Contrast  09/26/2014   CLINICAL DATA:  Unwitnessed fall. Down time unknown. Laceration to the left side of forehead. Patient fell out of wheelchair.  EXAM: CT HEAD WITHOUT CONTRAST  CT CERVICAL SPINE WITHOUT CONTRAST  TECHNIQUE: Multidetector CT imaging of the head and cervical spine was performed following the standard protocol without intravenous contrast. Multiplanar CT image reconstructions of the cervical spine were also generated.  COMPARISON:  None.  FINDINGS: CT HEAD FINDINGS  Postoperative changes with right temporal craniotomy and surgical clip in the right middle cranial fossa consistent with aneurysm clip. Encephalomalacia in the right frontotemporal and parietal lobe. Prominent ventricular dilatation consistent with volume loss. Diffuse cerebral atrophy.  There is focal acute subdural hematoma along the right frontoparietal region. Maximal depth is measured at about 1 cm although most of the subdural hematoma is not that large. There is no significant mass effect or midline shift. No sub arachnoid, intraventricular, or intraparenchymal hemorrhage. No depressed skull fractures. Retention cysts in the right maxillary antrum  and left sphenoid sinus. Mastoid air cells are not opacified. Vascular calcifications.  CT CERVICAL SPINE FINDINGS  There is reversal of the usual cervical lordosis, likely degenerative. No anterior subluxation. Normal alignment of facet joints. Coalition of the posterior elements at C3-4 bilaterally. No vertebral compression deformities. No prevertebral soft tissue swelling. Diffuse degenerative change throughout the cervical spine with narrowed cervical interspaces and endplate hypertrophic changes throughout. Degenerative changes throughout the facet joints. C1-2 articulation appears intact. Extensive vascular  calcifications in the cervical carotid vessels. This likely results in severe calcific stenosis of both common carotid arteries. Mid bilateral pleural effusions, greater on the left. Emphysematous changes and fibrosis in the lung apices. Visualized esophagus appears dilated with an air-fluid level. This may be due dysmotility or distal esophageal narrowing.  IMPRESSION: Acute right subdural hematoma with maximal depth of 1 cm. No mass effect or midline shift. Postoperative changes consistent with aneurysm clipping on the right with chronic encephalomalacia and cerebral atrophy.  Diffuse degenerative changes throughout the cervical spine likely accounts for reversal of cervical lordosis. No acute displaced fractures identified.  These results were called by telephone at the time of interpretation on 09/26/2014 at 8:51 pm to Dr. Milton Ferguson , who verbally acknowledged these results.   Electronically Signed   By: Lucienne Capers M.D.   On: 09/26/2014 20:59   Ct Cervical Spine Wo Contrast  09/26/2014   CLINICAL DATA:  Unwitnessed fall. Down time unknown. Laceration to the left side of forehead. Patient fell out of wheelchair.  EXAM: CT HEAD WITHOUT CONTRAST  CT CERVICAL SPINE WITHOUT CONTRAST  TECHNIQUE: Multidetector CT imaging of the head and cervical spine was performed following the standard protocol without intravenous contrast. Multiplanar CT image reconstructions of the cervical spine were also generated.  COMPARISON:  None.  FINDINGS: CT HEAD FINDINGS  Postoperative changes with right temporal craniotomy and surgical clip in the right middle cranial fossa consistent with aneurysm clip. Encephalomalacia in the right frontotemporal and parietal lobe. Prominent ventricular dilatation consistent with volume loss. Diffuse cerebral atrophy.  There is focal acute subdural hematoma along the right frontoparietal region. Maximal depth is measured at about 1 cm although most of the subdural hematoma is not that  large. There is no significant mass effect or midline shift. No sub arachnoid, intraventricular, or intraparenchymal hemorrhage. No depressed skull fractures. Retention cysts in the right maxillary antrum and left sphenoid sinus. Mastoid air cells are not opacified. Vascular calcifications.  CT CERVICAL SPINE FINDINGS  There is reversal of the usual cervical lordosis, likely degenerative. No anterior subluxation. Normal alignment of facet joints. Coalition of the posterior elements at C3-4 bilaterally. No vertebral compression deformities. No prevertebral soft tissue swelling. Diffuse degenerative change throughout the cervical spine with narrowed cervical interspaces and endplate hypertrophic changes throughout. Degenerative changes throughout the facet joints. C1-2 articulation appears intact. Extensive vascular calcifications in the cervical carotid vessels. This likely results in severe calcific stenosis of both common carotid arteries. Mid bilateral pleural effusions, greater on the left. Emphysematous changes and fibrosis in the lung apices. Visualized esophagus appears dilated with an air-fluid level. This may be due dysmotility or distal esophageal narrowing.  IMPRESSION: Acute right subdural hematoma with maximal depth of 1 cm. No mass effect or midline shift. Postoperative changes consistent with aneurysm clipping on the right with chronic encephalomalacia and cerebral atrophy.  Diffuse degenerative changes throughout the cervical spine likely accounts for reversal of cervical lordosis. No acute displaced fractures identified.  These results were called by telephone  at the time of interpretation on 09/26/2014 at 8:51 pm to Dr. Milton Ferguson , who verbally acknowledged these results.   Electronically Signed   By: Lucienne Capers M.D.   On: 09/26/2014 20:59   Dg Pelvis Portable  09/26/2014   CLINICAL DATA:  [place] out of wheelchair, unwitnessed fall at nursing home  EXAM: PORTABLE PELVIS 1-2 VIEWS   COMPARISON:  07/02/2006  FINDINGS: Osseous demineralization.  Hip and SI joints grossly symmetric and preserved.  Orthopedic hardware proximal LEFT femur post ORIF of an intertrochanteric fracture.  No definite acute fracture, dislocation, or bone destruction.  Soft tissue swelling laterally at LEFT hip region.  Extensive atherosclerotic calcifications.  IMPRESSION: Osseous demineralization with prior ORIF of the proximal LEFT femur.  No definite acute bony abnormalities.   Electronically Signed   By: Lavonia Dana M.D.   On: 09/26/2014 21:56   Dg Chest Port 1 View  09/26/2014   CLINICAL DATA:  Shortness of breath.  EXAM: PORTABLE CHEST - 1 VIEW  COMPARISON:  07/02/2006  FINDINGS: Mild cardiac enlargement noted. Diffuse bilateral hazy lung opacities are identified compatible with pulmonary edema. A more focal opacity is noted within the right midlung. The visualized osseous structures appear intact.  IMPRESSION: 1. Cardiac enlargement and pulmonary edema. 2. Focal opacity within the right midlung may represent superimposed pneumonia. Followup PA and lateral chest X-ray is recommended in 3-4 weeks following trial of antibiotic therapy to ensure resolution and exclude underlying malignancy.   Electronically Signed   By: Kerby Moors M.D.   On: 09/26/2014 21:57   Transthoracic echocardiogram Left ventricle: The cavity size was normal. Wall thickness was increased in a pattern of moderate LVH. Systolic function was vigorous. The estimated ejection fraction was in the range of 65% to 70%. Wall motion was normal; there were no regional wall motion abnormalities. Doppler parameters are consistent with abnormal left ventricular relaxation (grade 1 diastolic dysfunction). Doppler parameters are consistent with high ventricular filling pressure. - Pulmonary arteries: Systolic pressure was mildly increased. PA peak pressure: 42 mm Hg (S).  Impressions:  - Vigorous LV function; moderate LVH;  grade 1 diastolic dysfunction; intracavitary gradient of 1.4 m/s; trace MR and TR; mildly elevated pulmonary pressure. Microbiology: No results found for this or any previous visit (from the past 240 hour(s)).   Labs: Basic Metabolic Panel:  Recent Labs Lab 09/26/14 2215 09/26/14 2221 09/27/14 0154 09/28/14 0542  NA 141 142 141 140  K 3.3* 3.3* 3.1* 3.3*  CL 104 99* 103 97*  CO2 30  --  29 31  GLUCOSE 112* 111* 108* 75  BUN 14 14 9 12   CREATININE 0.67 0.80 0.80 1.00  CALCIUM 8.8*  --  8.6* 8.6*   Liver Function Tests:  Recent Labs Lab 09/26/14 2215 09/27/14 0154  AST 24 23  ALT 15 15  ALKPHOS 75 63  BILITOT 1.2 1.2  PROT 6.9 5.8*  ALBUMIN 3.3* 2.8*   No results for input(s): LIPASE, AMYLASE in the last 168 hours. No results for input(s): AMMONIA in the last 168 hours. CBC:  Recent Labs Lab 09/26/14 2215 09/26/14 2221 09/27/14 0154  WBC 6.9  --  6.7  NEUTROABS 4.4  --  4.8  HGB 12.8 15.0 11.8*  HCT 40.5 44.0 37.5  MCV 86.9  --  87.0  PLT 211  --  199   Cardiac Enzymes:  Recent Labs Lab 09/27/14 0154  TROPONINI 0.14*   BNP: BNP (last 3 results)  Recent Labs  09/26/14 2216  BNP 1808.5*    ProBNP (last 3 results) No results for input(s): PROBNP in the last 8760 hours.  CBG: No results for input(s): GLUCAP in the last 168 hours.     SignedDelfina Redwood  [facility] 09/28/2014, 11:19 AM

## 2014-09-28 NOTE — Clinical Social Work Note (Signed)
Clinical Social Work Assessment  Patient Details  Name: Debra York MRN: [id number] Date of Birth: 30-Sep-1924  Date of referral:  09/28/14               Reason for consult:   ([facility] )                Permission sought to share information with:  Facility Sport and exercise psychologist  Name::     Ms. Debra York   Agency::  [facility]   Relationship::  RN   Contact Information:  [phone]  Housing/Transportation Living arrangements for the past 2 months:  [place] of Information:  Facility Patient Interpreter Needed:  None Criminal Activity/Legal Involvement Pertinent to Current Situation/Hospitalization:  No - Comment as needed Significant Relationships:  Other Family Members Lives with:  Facility Resident Do you feel safe going back to the place where you live?  Yes Need for family participation in patient care:  No (Coment)  Care giving concerns: N/A  Facilities manager / plan: CSW met the pt at the bedside. Given the pt's orientation level the CSW could not gather assessment information. CSW called the pt's emergency contact, but the number is not working. CSW called [facility]. Debra York confirmed that the pt is a resident at their ALF. Ms. Tamala York reported that she will need to review the pt's clinical before she can accept the pt back. CSW faxed the pt's clinical to Debra York. CSW will continue to follow this pt and assist with discharge as needed.   Employment status:  Retired Forensic scientist:  Medicare PT Recommendations:  Not assessed at this time [place] / Referral to community resources:   (return to ALF )  Patient/Family's Response to care:  N/A  Patient/Family's Understanding of and Emotional Response to Diagnosis, Current Treatment, and Prognosis:  N/A  Emotional Assessment Appearance:   (Unable to Assess ) Attitude/Demeanor/Rapport:  Unable to Assess Affect (typically observed):  Unable to  Assess Orientation:  Oriented to Self Alcohol / Substance use:  Not Applicable Psych involvement (Current and /or in the community):  No (Comment)  Discharge Needs  Concerns to be addressed:  Denies Needs/Concerns at this time Readmission within the last 30 days:  No Current discharge risk:  None Barriers to Discharge:  No Barriers Identified   Debra Vitelli, LCSW 09/28/2014, 10:43 AM

## 2014-09-28 NOTE — Clinical Documentation Improvement (Signed)
Hospitalist  Can the diagnosis of CHF be further specified?    Acuity - Acute, Chronic, Acute on Chronic   Type - Systolic, Diastolic, Systolic and Diastolic  Other  Clinically Undetermined  Document any associated diagnoses/conditions  Supporting Information:  CHF documented in H&P  BNP 1800  Treated with IV Lasix and PO Lopressor  Please exercise your independent, professional judgment when responding. A specific answer is not anticipated or expected.  Thank You,  [place] [phone]

## 2014-09-28 NOTE — Evaluation (Signed)
Physical Therapy Evaluation Patient Details Name: Debra York MRN: [id number] DOB: 1925/01/09 Today's Date: 09/28/2014   History of Present Illness  81 y.o. female admitted to [facility] on 09/26/14 for fall and SDH.  Pt with significant PMHx of HTN, CVA with L hemiperesis, foot surgery (no specified side).  Clinical Impression  Pt presents close to her reported baseline (assist for transfers, assist for bathing and dressing, assist for eating).  I have asked RN CM to contact her ALF to see if they are willing/able to take her back at her baseline level of care.  She would certainly be appropriate for SNF placement if they are unwilling to take her back.   PT to follow acutely for deficits listed below.       Follow Up Recommendations No PT follow up;Other (comment) (pt is likely at her baseline)    Equipment Recommendations  None recommended by PT    Recommendations for Other Services   NA    Precautions / Restrictions Precautions Precautions: Fall Precaution Comments: h/o falls, L hemiperesis      Mobility  Bed Mobility Overal bed mobility: Needs Assistance Bed Mobility: Supine to Sit     Supine to sit: HOB elevated;Total assist     General bed mobility comments: Pt needs total assist to get to sitting EOB to progress bil legs and help support trunk to get upright on EOB.  Initially, pt very fearful at EOB, but was able to calm with support and once she felt her right foot on the ground.   Transfers Overall transfer level: Needs assistance Equipment used: None Transfers: Squat Pivot Transfers     Squat pivot transfers: Max assist;From elevated surface        Ambulation/Gait             General Gait Details: unable at baseline         Balance Overall balance assessment: Needs assistance Sitting-balance support: Feet supported;Single extremity supported (right foot supported, left flexed up) Sitting balance-Leahy Scale: Poor Sitting balance - Comments:  Needs max assist EOB to maintain sitting balance, pt assisting some with her right hand and right foot once floor felt under her.    Standing balance support: Single extremity supported Standing balance-Leahy Scale: Zero Standing balance comment: max support needed.                              Pertinent Vitals/Pain Pain Assessment: No/denies pain    Home Living Family/patient expects to be discharged to:: Assisted living Living Arrangements: Alone Available Help at Discharge: Personal care attendant Type of Home: Apartment Home Access: Level entry     Home Layout: One level        Prior Function Level of Independence: Needs assistance   Gait / Transfers Assistance Needed: per pt she needed assist to get into a WC, once in the [facility] she would use her feet to get around.  ADL's / Homemaking Assistance Needed: Assist from aids at ALF        Hand Dominance   Dominant Hand: Right    Extremity/Trunk Assessment   Upper Extremity Assessment: LUE deficits/detail       LUE Deficits / Details: left arm most significantly impaired.  Pt with left hemiperesis, shoulder girdle seems pretty stable, flexion contractures.  Pt can assist with right arm, however, generally weak and not assessed over 90 of flexion.    Lower Extremity Assessment: RLE deficits/detail;LLE deficits/detail RLE  Deficits / Details: right leg generally weak with decreased hamstring flexibility leading to decreased knee extension ROM.  Pt able to bear some weight (assisted ) on right leg during transfer to chair.  LLE Deficits / Details: left leg flexion contractures due to left hemiperesis s/p stroke.   Cervical / Trunk Assessment: Kyphotic  Communication      Cognition Arousal/Alertness: Awake/alert Behavior During Therapy: WFL for tasks assessed/performed Overall Cognitive Status: No family/caregiver present to determine baseline cognitive functioning                                Assessment/Plan    PT Assessment Patient needs continued PT services  PT Diagnosis Difficulty walking;Abnormality of gait;Generalized weakness;Hemiplegia non-dominant side   PT Problem List Decreased strength;Decreased activity tolerance;Decreased balance;Decreased mobility;Decreased range of motion;Decreased coordination;Decreased cognition;Decreased knowledge of use of DME  PT Treatment Interventions DME instruction;Functional mobility training;Therapeutic activities;Therapeutic exercise;Balance training;Neuromuscular re-education;Patient/family education;Cognitive remediation   PT Goals (Current goals can be found in the Care Plan section) Acute Rehab PT Goals Patient Stated Goal: to go back to ALF PT Goal Formulation: With patient Time For Goal Achievement: 10/12/14 Potential to Achieve Goals: Fair    Frequency Min 3X/week        End of Session   Activity Tolerance: Patient tolerated treatment well Patient left: in chair;with call bell/phone within reach;with chair alarm set Nurse Communication: Mobility status        Time: 1038-1100 PT Time Calculation (min) (ACUTE ONLY): 22 min   Charges:   PT Evaluation $Initial PT Evaluation Tier I: 1 Procedure          Keilyn Nadal B. [place], [place], DPT [phone]   09/28/2014, 11:14 AM

## 2014-09-28 NOTE — Progress Notes (Signed)
Discharge orders received.  VSS upon discharge.  IV removed and education complete.  Transported via [facility].  Report called to [place] at [facility]. Cori Razor, RN

## 2014-09-28 NOTE — Clinical Social Work Note (Signed)
CSW spoke with Debra York from [place] to informed her that the pt would be returning today. CSW discussed ambulance transport.CSW contact [facility] at [phone] to schedule transport for the pt. CSW upload the pt's discharge summary. Bedside RN can call reported to [phone].   [place], MSW, [place]

## 2014-10-03 ENCOUNTER — Inpatient Hospital Stay (HOSPITAL_COMMUNITY)
Admission: EM | Admit: 2014-10-03 | Discharge: 2014-10-07 | DRG: 189 | Disposition: A | Discharge status: Hospice | Payer: Medicare Other | Attending: Internal Medicine | Admitting: Internal Medicine

## 2014-10-03 ENCOUNTER — Inpatient Hospital Stay (HOSPITAL_COMMUNITY): Payer: Medicare Other

## 2014-10-03 ENCOUNTER — Encounter (HOSPITAL_COMMUNITY): Payer: Self-pay | Admitting: Emergency Medicine

## 2014-10-03 ENCOUNTER — Emergency Department (HOSPITAL_COMMUNITY): Payer: Medicare Other

## 2014-10-03 DIAGNOSIS — S065X0A Traumatic subdural hemorrhage without loss of consciousness, initial encounter: Secondary | ICD-10-CM | POA: Diagnosis not present

## 2014-10-03 DIAGNOSIS — F17210 Nicotine dependence, cigarettes, uncomplicated: Secondary | ICD-10-CM | POA: Diagnosis present

## 2014-10-03 DIAGNOSIS — W19XXXA Unspecified fall, initial encounter: Secondary | ICD-10-CM | POA: Diagnosis not present

## 2014-10-03 DIAGNOSIS — I82622 Acute embolism and thrombosis of deep veins of left upper extremity: Secondary | ICD-10-CM | POA: Diagnosis present

## 2014-10-03 DIAGNOSIS — G4700 Insomnia, unspecified: Secondary | ICD-10-CM | POA: Diagnosis present

## 2014-10-03 DIAGNOSIS — R918 Other nonspecific abnormal finding of lung field: Secondary | ICD-10-CM | POA: Diagnosis present

## 2014-10-03 DIAGNOSIS — K219 Gastro-esophageal reflux disease without esophagitis: Secondary | ICD-10-CM | POA: Diagnosis present

## 2014-10-03 DIAGNOSIS — G9340 Encephalopathy, unspecified: Secondary | ICD-10-CM | POA: Diagnosis present

## 2014-10-03 DIAGNOSIS — E876 Hypokalemia: Secondary | ICD-10-CM | POA: Diagnosis not present

## 2014-10-03 DIAGNOSIS — I82629 Acute embolism and thrombosis of deep veins of unspecified upper extremity: Secondary | ICD-10-CM

## 2014-10-03 DIAGNOSIS — E46 Unspecified protein-calorie malnutrition: Secondary | ICD-10-CM | POA: Diagnosis present

## 2014-10-03 DIAGNOSIS — E872 Acidosis: Secondary | ICD-10-CM | POA: Diagnosis present

## 2014-10-03 DIAGNOSIS — L8990 Pressure ulcer of unspecified site, unspecified stage: Secondary | ICD-10-CM | POA: Insufficient documentation

## 2014-10-03 DIAGNOSIS — R609 Edema, unspecified: Secondary | ICD-10-CM | POA: Diagnosis not present

## 2014-10-03 DIAGNOSIS — I10 Essential (primary) hypertension: Secondary | ICD-10-CM | POA: Diagnosis present

## 2014-10-03 DIAGNOSIS — J9601 Acute respiratory failure with hypoxia: Secondary | ICD-10-CM | POA: Diagnosis present

## 2014-10-03 DIAGNOSIS — Z79899 Other long term (current) drug therapy: Secondary | ICD-10-CM

## 2014-10-03 DIAGNOSIS — Z66 Do not resuscitate: Secondary | ICD-10-CM | POA: Diagnosis present

## 2014-10-03 DIAGNOSIS — I69354 Hemiplegia and hemiparesis following cerebral infarction affecting left non-dominant side: Secondary | ICD-10-CM

## 2014-10-03 DIAGNOSIS — N179 Acute kidney failure, unspecified: Secondary | ICD-10-CM | POA: Diagnosis present

## 2014-10-03 DIAGNOSIS — J189 Pneumonia, unspecified organism: Secondary | ICD-10-CM | POA: Diagnosis present

## 2014-10-03 DIAGNOSIS — Y95 Nosocomial condition: Secondary | ICD-10-CM | POA: Diagnosis not present

## 2014-10-03 DIAGNOSIS — I69992 Facial weakness following unspecified cerebrovascular disease: Secondary | ICD-10-CM

## 2014-10-03 DIAGNOSIS — J90 Pleural effusion, not elsewhere classified: Secondary | ICD-10-CM

## 2014-10-03 DIAGNOSIS — Z681 Body mass index (BMI) 19 or less, adult: Secondary | ICD-10-CM

## 2014-10-03 DIAGNOSIS — T148XXA Other injury of unspecified body region, initial encounter: Secondary | ICD-10-CM

## 2014-10-03 DIAGNOSIS — I6200 Nontraumatic subdural hemorrhage, unspecified: Secondary | ICD-10-CM | POA: Diagnosis not present

## 2014-10-03 DIAGNOSIS — M25551 Pain in right hip: Secondary | ICD-10-CM | POA: Diagnosis not present

## 2014-10-03 DIAGNOSIS — S065XAA Traumatic subdural hemorrhage with loss of consciousness status unknown, initial encounter: Secondary | ICD-10-CM | POA: Diagnosis present

## 2014-10-03 DIAGNOSIS — Z86718 Personal history of other venous thrombosis and embolism: Secondary | ICD-10-CM | POA: Diagnosis not present

## 2014-10-03 DIAGNOSIS — Z515 Encounter for palliative care: Secondary | ICD-10-CM | POA: Diagnosis not present

## 2014-10-03 DIAGNOSIS — I248 Other forms of acute ischemic heart disease: Secondary | ICD-10-CM | POA: Diagnosis present

## 2014-10-03 DIAGNOSIS — J948 Other specified pleural conditions: Secondary | ICD-10-CM | POA: Diagnosis not present

## 2014-10-03 DIAGNOSIS — S065X9A Traumatic subdural hemorrhage with loss of consciousness of unspecified duration, initial encounter: Secondary | ICD-10-CM | POA: Diagnosis present

## 2014-10-03 DIAGNOSIS — Z7982 Long term (current) use of aspirin: Secondary | ICD-10-CM

## 2014-10-03 DIAGNOSIS — I160 Hypertensive urgency: Secondary | ICD-10-CM

## 2014-10-03 LAB — CBC WITH DIFFERENTIAL/PLATELET
BASOS PCT: 0 % (ref 0–1)
Basophils Absolute: 0 10*3/uL (ref 0.0–0.1)
EOS ABS: 0.2 10*3/uL (ref 0.0–0.7)
Eosinophils Relative: 3 % (ref 0–5)
HCT: 45.8 % (ref 36.0–46.0)
HEMOGLOBIN: 14.3 g/dL (ref 12.0–15.0)
Lymphocytes Relative: 15 % (ref 12–46)
Lymphs Abs: 1 10*3/uL (ref 0.7–4.0)
MCH: 27.2 pg (ref 26.0–34.0)
MCHC: 31.2 g/dL (ref 30.0–36.0)
MCV: 87.1 fL (ref 78.0–100.0)
MONOS PCT: 15 % — AB (ref 3–12)
Monocytes Absolute: 1 10*3/uL (ref 0.1–1.0)
NEUTROS ABS: 4.4 10*3/uL (ref 1.7–7.7)
NEUTROS PCT: 67 % (ref 43–77)
Platelets: 253 10*3/uL (ref 150–400)
RBC: 5.26 MIL/uL — AB (ref 3.87–5.11)
RDW: 15.7 % — ABNORMAL HIGH (ref 11.5–15.5)
WBC: 6.6 10*3/uL (ref 4.0–10.5)

## 2014-10-03 LAB — COMPREHENSIVE METABOLIC PANEL
ALBUMIN: 3.1 g/dL — AB (ref 3.5–5.0)
ALK PHOS: 77 U/L (ref 38–126)
ALT: 17 U/L (ref 14–54)
ANION GAP: 7 (ref 5–15)
AST: 34 U/L (ref 15–41)
BILIRUBIN TOTAL: 0.9 mg/dL (ref 0.3–1.2)
BUN: 18 mg/dL (ref 6–20)
CALCIUM: 8.9 mg/dL (ref 8.9–10.3)
CO2: 35 mmol/L — AB (ref 22–32)
CREATININE: 1.1 mg/dL — AB (ref 0.44–1.00)
Chloride: 101 mmol/L (ref 101–111)
GFR calc Af Amer: 51 mL/min — ABNORMAL LOW (ref >60)
GFR calc non Af Amer: 44 mL/min — ABNORMAL LOW (ref >60)
GLUCOSE: 116 mg/dL — AB (ref 65–99)
Potassium: 3.5 mmol/L (ref 3.5–5.1)
SODIUM: 143 mmol/L (ref 135–145)
TOTAL PROTEIN: 6.8 g/dL (ref 6.5–8.1)

## 2014-10-03 LAB — URINALYSIS, ROUTINE W REFLEX MICROSCOPIC
Glucose, UA: NEGATIVE mg/dL
KETONES UR: NEGATIVE mg/dL
NITRITE: NEGATIVE
PH: 5 (ref 5.0–8.0)
Protein, ur: 100 mg/dL — AB
SPECIFIC GRAVITY, URINE: 1.028 (ref 1.005–1.030)
UROBILINOGEN UA: 0.2 mg/dL (ref 0.0–1.0)

## 2014-10-03 LAB — URINE MICROSCOPIC-ADD ON

## 2014-10-03 LAB — TROPONIN I: TROPONIN I: 0.31 ng/mL — AB (ref <0.031)

## 2014-10-03 LAB — I-STAT CG4 LACTIC ACID, ED: Lactic Acid, Venous: 3.39 mmol/L (ref 0.5–2.0)

## 2014-10-03 LAB — STREP PNEUMONIAE URINARY ANTIGEN: Strep Pneumo Urinary Antigen: NEGATIVE

## 2014-10-03 LAB — MRSA PCR SCREENING: MRSA by PCR: NEGATIVE

## 2014-10-03 LAB — LACTIC ACID, PLASMA: LACTIC ACID, VENOUS: 1.8 mmol/L (ref 0.5–2.0)

## 2014-10-03 LAB — BRAIN NATRIURETIC PEPTIDE: B Natriuretic Peptide: 1270.2 pg/mL — ABNORMAL HIGH (ref 0.0–100.0)

## 2014-10-03 MED ORDER — ONDANSETRON HCL 4 MG/2ML IJ SOLN: 4.0000 mg | Freq: Four times a day (QID) | INTRAMUSCULAR | Status: DC | PRN

## 2014-10-03 MED ORDER — IOHEXOL 300 MG/ML  SOLN
75.0000 mL | Freq: Once | INTRAMUSCULAR | Status: AC | PRN
  Administered 2014-10-03: 75 mL via INTRAVENOUS

## 2014-10-03 MED ORDER — ONDANSETRON HCL 4 MG PO TABS: 4.0000 mg | ORAL_TABLET | Freq: Four times a day (QID) | ORAL | Status: DC | PRN

## 2014-10-03 MED ORDER — ACETAMINOPHEN 650 MG RE SUPP: 650.0000 mg | Freq: Four times a day (QID) | RECTAL | Status: DC | PRN

## 2014-10-03 MED ORDER — SODIUM CHLORIDE 0.9 % IV SOLN
INTRAVENOUS | Status: DC
  Administered 2014-10-03 – 2014-10-05 (×4): via INTRAVENOUS

## 2014-10-03 MED ORDER — VANCOMYCIN HCL IN DEXTROSE 1-5 GM/200ML-% IV SOLN
1000.0000 mg | Freq: Once | INTRAVENOUS | Status: AC
  Administered 2014-10-03: 1000 mg via INTRAVENOUS
  Filled 2014-10-03: qty 200

## 2014-10-03 MED ORDER — VANCOMYCIN HCL 500 MG IV SOLR
500.0000 mg | INTRAVENOUS | Status: DC
  Administered 2014-10-04 – 2014-10-05 (×2): 500 mg via INTRAVENOUS
  Filled 2014-10-03 (×3): qty 500

## 2014-10-03 MED ORDER — HYDRALAZINE HCL 20 MG/ML IJ SOLN
10.0000 mg | Freq: Four times a day (QID) | INTRAMUSCULAR | Status: DC | PRN
  Filled 2014-10-03: qty 1

## 2014-10-03 MED ORDER — INFLUENZA VAC SPLIT QUAD 0.5 ML IM SUSY
0.5000 mL | PREFILLED_SYRINGE | INTRAMUSCULAR | Status: AC
  Administered 2014-10-04: 0.5 mL via INTRAMUSCULAR
  Filled 2014-10-03 (×2): qty 0.5

## 2014-10-03 MED ORDER — ALUM & MAG HYDROXIDE-SIMETH 200-200-20 MG/5ML PO SUSP: 30.0000 mL | Freq: Four times a day (QID) | ORAL | Status: DC | PRN

## 2014-10-03 MED ORDER — ACETAMINOPHEN 325 MG PO TABS
650.0000 mg | ORAL_TABLET | Freq: Four times a day (QID) | ORAL | Status: DC | PRN
  Administered 2014-10-05 – 2014-10-07 (×2): 650 mg via ORAL
  Filled 2014-10-03 (×2): qty 2

## 2014-10-03 MED ORDER — PIPERACILLIN-TAZOBACTAM 3.375 G IVPB
3.3750 g | Freq: Once | INTRAVENOUS | Status: AC
  Administered 2014-10-03: 3.375 g via INTRAVENOUS
  Filled 2014-10-03: qty 50

## 2014-10-03 MED ORDER — HYDRALAZINE HCL 20 MG/ML IJ SOLN
20.0000 mg | Freq: Once | INTRAMUSCULAR | Status: AC
  Administered 2014-10-03: 20 mg via INTRAVENOUS
  Filled 2014-10-03: qty 1

## 2014-10-03 MED ORDER — PIPERACILLIN-TAZOBACTAM 3.375 G IVPB 30 MIN
3.3750 g | Freq: Three times a day (TID) | INTRAVENOUS | Status: DC
  Filled 2014-10-03: qty 50

## 2014-10-03 MED ORDER — ENSURE ENLIVE PO LIQD
237.0000 mL | Freq: Two times a day (BID) | ORAL | Status: DC
  Administered 2014-10-04 – 2014-10-05 (×2): 237 mL via ORAL

## 2014-10-03 MED ORDER — PIPERACILLIN-TAZOBACTAM 3.375 G IVPB
3.3750 g | Freq: Three times a day (TID) | INTRAVENOUS | Status: DC
  Administered 2014-10-03 – 2014-10-06 (×8): 3.375 g via INTRAVENOUS
  Filled 2014-10-03 (×9): qty 50

## 2014-10-03 MED ORDER — SODIUM CHLORIDE 0.9 % IV SOLN
INTRAVENOUS | Status: DC
  Administered 2014-10-03: 16:00:00 via INTRAVENOUS
  Administered 2014-10-05: 20 mL/h via INTRAVENOUS

## 2014-10-03 MED ORDER — ENOXAPARIN SODIUM 30 MG/0.3ML ~~LOC~~ SOLN
30.0000 mg | SUBCUTANEOUS | Status: DC
  Administered 2014-10-03 – 2014-10-04 (×2): 30 mg via SUBCUTANEOUS
  Filled 2014-10-03 (×3): qty 0.3

## 2014-10-03 NOTE — H&P (Signed)
[facility] History and Physical  Debra York BMW:[id number] DOB: 07-31-1924 DOA: 10/03/2014   PCP: Reymundo Poll, MD    Chief Complaint: Sent from nursing home for lethargy  HPI: Debra York is a 81 y.o. female with hypertension who was admitted from 8/22 through 8/24 after a fall and right subdural hematoma. Neurosurgery recommended observation. She was noted to be hypoxic with a chest x-ray showing right lower lobe infiltrate and was started on Avelox prior to discharge. She returns from the nursing facility due to confusion. There is no one here to give a history. She's had a CT of the head which shows a nearly resolved right subdural hematoma. She continues to be hypoxic and is requiring 3 L of oxygen. A CT scan of the chest reveals a right sided effusion and a spiculated right middle lobe mass. According to the RN who received report, at baseline the patient feeds herself and is able to ambulate. According to previous H&P, she has left-sided hemiparesis secondary to CVA.   Review of systems: Unable to obtain due to lethargy  Past Medical History  Diagnosis Date  . Hypertension   . Constipation   . Cerebral vascular accident   . Uterine neoplasm   . Cough   . Insomnia   . Gastroesophageal reflux disease   . Allergic rhinitis   . Vitamin D deficiency     Past Surgical History  Procedure Laterality Date  . Foot surgery      Social History:  Social History   Social History  . Marital Status: Widowed    Spouse Name: N/A  . Number of Children: N/A  . Years of Education: N/A   Occupational History  . Not on file.   Social History Main Topics  . Smoking status: Current Every Day Smoker  . Smokeless tobacco: Never Used  . Alcohol Use: No     Comment: Quit.  . Drug Use: No  . Sexual Activity: No   Other Topics Concern  . Not on file   Social History Narrative    Lives at nursing facility    No Known Allergies  Family history:    Family History  Problem Relation Age of Onset  . Stroke Neg Hx   . Diabetes Mellitus II Neg Hx       Prior to Admission medications   Medication Sig Start Date End Date Taking? Authorizing Provider  acetaminophen (TYLENOL) 325 MG tablet Take 650 mg by mouth every 6 (six) hours as needed for mild pain, fever or headache.   Yes Historical Provider, MD  aspirin 81 MG chewable tablet Chew 81 mg by mouth daily.   Yes Historical Provider, MD  cetirizine (ZYRTEC) 10 MG tablet Take 10 mg by mouth daily.   Yes Historical Provider, MD  cholecalciferol (VITAMIN D) 400 UNITS TABS tablet Take 800 Units by mouth.   Yes Historical Provider, MD  ferrous sulfate 325 (65 FE) MG tablet Take 325 mg by mouth daily with breakfast.   Yes Historical Provider, MD  hydrALAZINE (APRESOLINE) 25 MG tablet Take 25 mg by mouth 3 (three) times daily.   Yes Historical Provider, MD  hydrocerin (EUCERIN) CREA Apply 1 application topically 2 (two) times daily. To legs and feet.   Yes Historical Provider, MD  metoprolol (LOPRESSOR) 100 MG tablet Take 100 mg by mouth 2 (two) times daily.   Yes Historical Provider, MD  moxifloxacin (AVELOX) 400 MG tablet Take 1 tablet (400 mg total) by mouth daily at 8 pm.  Through 10/03/14 Patient taking differently: Take 400 mg by mouth daily at 8 pm.  09/28/14  Yes Delfina Redwood, MD  simvastatin (ZOCOR) 10 MG tablet Take 10 mg by mouth daily.   Yes Historical Provider, MD  sucralfate (CARAFATE) 1 G tablet Take 1 g by mouth 4 (four) times daily -  with meals and at bedtime.   Yes Historical Provider, MD     Physical Exam: Filed Vitals:   10/03/14 1331 10/03/14 1409 10/03/14 1434 10/03/14 1652  BP:    211/113  Pulse:    65  Temp:   98.9 F (37.2 C) 98.5 F (36.9 C)  TempSrc:   Rectal Rectal  Resp:    22  SpO2: 96% 96%  100%     General: Sleepy- able to tell me her name otherwise confused. No acute distress HEENT: Normocephalic and Atraumatic, Mucous membranes pink                 PERRLA; EOM intact; No scleral icterus,                 Nares: Patent                Neck: FROM, no cervical lymphadenopathy, thyromegaly, carotid bruit or JVD;  Breasts: deferred CHEST WALL: No tenderness  CHEST: Decreased breath sounds in right mid and lower lung field-left lung field clear HEART: Regular rate and rhythm; no murmurs rubs or gallops  BACK: No kyphosis or scoliosis; no CVA tenderness  GI: Positive Bowel Sounds, soft, non-tender; no masses, no organomegaly Rectal Exam: deferred MSK: No cyanosis, clubbing-left hand swollen nontender Genitalia: not examined  SKIN:  no rash or ulceration  CNS: Sleepy-not following commands-able to move her right arm-left arm immobile and contracted-not moving legs at this time , left facial droop  Labs on Admission:  Basic Metabolic Panel:  Recent Labs Lab 09/26/14 2215 09/26/14 2221 09/27/14 0154 09/28/14 0542 10/03/14 1420  NA 141 142 141 140 143  K 3.3* 3.3* 3.1* 3.3* 3.5  CL 104 99* 103 97* 101  CO2 30  --  29 31 35*  GLUCOSE 112* 111* 108* 75 116*  BUN 14 14 9 12 18   CREATININE 0.67 0.80 0.80 1.00 1.10*  CALCIUM 8.8*  --  8.6* 8.6* 8.9   Liver Function Tests:  Recent Labs Lab 09/26/14 2215 09/27/14 0154 10/03/14 1420  AST 24 23 34  ALT 15 15 17   ALKPHOS 75 63 77  BILITOT 1.2 1.2 0.9  PROT 6.9 5.8* 6.8  ALBUMIN 3.3* 2.8* 3.1*   No results for input(s): LIPASE, AMYLASE in the last 168 hours. No results for input(s): AMMONIA in the last 168 hours. CBC:  Recent Labs Lab 09/26/14 2215 09/26/14 2221 09/27/14 0154 10/03/14 1420  WBC 6.9  --  6.7 6.6  NEUTROABS 4.4  --  4.8 4.4  HGB 12.8 15.0 11.8* 14.3  HCT 40.5 44.0 37.5 45.8  MCV 86.9  --  87.0 87.1  PLT 211  --  199 253   Cardiac Enzymes:  Recent Labs Lab 09/27/14 0154 10/03/14 1601  TROPONINI 0.14* 0.31*    BNP (last 3 results)  Recent Labs  09/26/14 2216 10/03/14 1601  BNP 1808.5* 1270.2*    ProBNP (last 3 results) No results for  input(s): PROBNP in the last 8760 hours.  CBG: No results for input(s): GLUCAP in the last 168 hours.  Radiological Exams on Admission: Dg Chest 2 View  10/03/2014   CLINICAL DATA:  Hypertension, congestion, decreased appetite.  EXAM: CHEST  2 VIEW  COMPARISON:  September 26, 2014  FINDINGS: The heart size and mediastinal contours are stable. The heart size is enlarged. The aorta is tortuous. There is a 2.5 cm masslike lesion in the right mid to lung base. There is a question 2.8 cm mass in the left mid to lung base. There is central pulmonary vascular congestion. There is patchy consolidation of the left lung base. There are bilateral posterior pleural effusions. The visualized skeletal structures are stable.  IMPRESSION: There is a 2.5 cm masslike lesion in the right lateral mid to lung base. Question 2.8 cm mass in the left lung. Further evaluation with chest CT is recommended.  Consolidation of left lung base with left pleural effusion. This at least in part due to atelectasis but underlying pneumonia is not excluded.   Electronically Signed   By: Abelardo Diesel M.D.   On: 10/03/2014 15:03   Ct Head Wo Contrast  10/03/2014   CLINICAL DATA:  Altered mental status.  EXAM: CT HEAD WITHOUT CONTRAST  TECHNIQUE: Contiguous axial images were obtained from the base of the skull through the vertex without contrast.  COMPARISON:  09/27/2014 and 09/26/1958  FINDINGS: There is minimal residual extra-axial blood along the right frontal-parietal convexity. Remaining blood products are best seen on sequence 5, image 5. Again noted is encephalomalacia throughout the right cerebral hemisphere with ex vacuo dilatation of the right lateral ventricle. There is no significant midline shift. No evidence for an acute hemorrhage, mass lesion or new large infarct. Mild soft tissue swelling in the left periorbital region.  Postsurgical changes related to a right temporal craniotomy and aneurysm clip near the right middle cranial  fossa. Evidence for a large polyp or retention cyst in the right maxillary sinus.  IMPRESSION: No acute intracranial abnormality.  Right subdural hematoma has nearly resolved.  Chronic encephalomalacia in the right cerebral cortex with postsurgical changes.   Electronically Signed   By: Markus Daft M.D.   On: 10/03/2014 15:28   Ct Chest W Contrast  10/03/2014   CLINICAL DATA:  Shortness of breath.  Possible right lung mass.  EXAM: CT CHEST WITH CONTRAST  TECHNIQUE: Multidetector CT imaging of the chest was performed during intravenous contrast administration.  CONTRAST:  30mL OMNIPAQUE IOHEXOL 300 MG/ML  SOLN  COMPARISON:  Chest x-ray dated 10/03/2014  FINDINGS: There is no evidence of pulmonary embolus. The heart size seems enlarged. There is no pericardial effusion. There are heavy atherosclerotic calcifications of the aorta.  There is a 1.2 cm right precarinal lymph node with rounded appearance. The esophagus is dilated for most of its course.  There is a spiculated soft tissue mass within the lateral aspect of the right middle lobe which measures 2.5 by 1.8 by 2.0 cm, concerning for primary lung malignancy. Spiculations extend into the lateral pleural and interlobar fissure planes. There is also a nodular subpleural scarring in the right lower lobe causing tenting measuring 1.8 by 1.4 cm. Ground-glass opacities are seen within the right lower lobe. There are bilateral pleural effusion with subsegmental bibasilar atelectasis. No axillary lymphadenopathy is seen. The visualized portions of the thyroid gland are unremarkable in appearance.  The visualized portions of the stomach, liver and spleen are unremarkable.  No acute osseous abnormalities are seen.  IMPRESSION: Spiculated right middle lobe mass, concerning for primary lung malignancy.  Nodular right lower lobe subpleural thickening. This may represent scarring versus a second mass.  Indeterminate right precarinal lymph  node.  Bilateral pleural effusions  with subsegmental atelectasis.  PET-CT or tissue sampling may be considered for further diagnosis.   Electronically Signed   By: Fidela Salisbury M.D.   On: 10/03/2014 15:58     Assessment/Plan Principal Problem:   Acute respiratory failure with hypoxemia- HCAP?- Lactic acidosis -Due to right sided pleural effusion -difficult to tell if there is an underlying pneumonia but as lactic acid is elevated, she is encephalopathic and there was cough when she was last discharged, she has been started on vancomycin and Zosyn to cover for health care acquired pneumonia  Active Problems: Acute encephalopathy -See above- - CT of the head shows improving subdural hematoma    Lung mass with right pleural effusion - Spiculated right sided lung mass with pleural effusion seen on CT of the chest -Hold off on workup until I am able to contact family members or until patient becomes more alert enough to tell us whether she would like a work up    Hypertensive urgency -EP checked multiple times and also checked manually by myself -We will give IV hydralazine-heart rate is in the 60s therefore cannot give beta blockers IV   Subdural hematoma -CT of the head shows improvement  CVA with left-sided hemiparesis  Swelling of left hand -Check venous duplex  Consulted:   Code Status: DO NOT RESUSCITATE last admission-will continue this Family Communication: I have tried to contact the number on the face sheet Debra York is a nonfunctioning number DVT Prophylaxis: Lovenox  Time spent: 63 minutes  [place], MD [facility]  If 7PM-7AM, please contact night-coverage [email or web address] 10/03/2014, 5:24 PM

## 2014-10-03 NOTE — Progress Notes (Signed)
ANTIBIOTIC CONSULT NOTE - INITIAL  Pharmacy Consult for Vancomycin Indication: HCAP  No Known Allergies  Patient Measurements:   Adjusted Body Weight:   Vital Signs: Temp: 98.5 F (36.9 C) (08/29 1652) Temp Source: Rectal (08/29 1652) BP: 211/113 mmHg (08/29 1652) Pulse Rate: 65 (08/29 1652) Intake/Output from previous day:   Intake/Output from this shift: Total I/O In: -  Out: 150 [Urine:150]  Labs:  Recent Labs  10/03/14 1420  WBC 6.6  HGB 14.3  PLT 253  CREATININE 1.10*   Estimated Creatinine Clearance: 28 mL/min (by C-G formula based on Cr of 1.1). No results for input(s): VANCOTROUGH, VANCOPEAK, VANCORANDOM, GENTTROUGH, GENTPEAK, GENTRANDOM, TOBRATROUGH, TOBRAPEAK, TOBRARND, AMIKACINPEAK, AMIKACINTROU, AMIKACIN in the last 72 hours.   Microbiology: No results found for this or any previous visit (from the past 720 hour(s)).  Medical History: Past Medical History  Diagnosis Date  . Hypertension   . Constipation   . Cerebral vascular accident   . Uterine neoplasm   . Cough   . Insomnia   . Gastroesophageal reflux disease   . Allergic rhinitis   . Vitamin D deficiency    Assessment: 36 yoF from NH presents with AMS and lethargy. PMHx significant for recent hospitalization for fall, right subdural hematoma, and PNA.  Today, pt noted to be hypoxic with elevated lactate. Chest x-ray cannot exclude underlying PNA.   CT chest shows spiculated RML mass, concerning for primary lung malignancy as well as bilateral pleural effusions with atelectasis.  Pharmacy consulted to start broad spectrum antibiotics for HCAP.   Anti-infectives 8/29 >> Vancomycin  >> 8/29 >> Zosyn  >>    Vitals/Labs WBC: WNL Tm24h: 98.9 SCr: 1.10, elevated from last admission, CrCl ~28 ml/min (N40)  Cultures 8/29 bloodx2: collected 8/29 Strep pneumo: IP 8/29 Legionella: IP 8/29 Sputum: ordered  Goal of Therapy:  Vancomycin trough level 15-20 mcg/ml  Eradication of  infection  Plan:  Vancomycin 1g x 1 in ED, then 500mg  IV q24h Zosyn 3.375g IV q8h (infuse over 4 hours) F/u renal function, cultures, clinical course  Ralene Bathe, PharmD, BCPS 10/03/2014, 5:40 PM  Pager: [phone]

## 2014-10-03 NOTE — ED Notes (Signed)
Radiology bedside.

## 2014-10-03 NOTE — ED Notes (Signed)
Bed: WA07 Expected date:  Expected time:  Means of arrival:  Comments: Ems- 81 yo F, PNA?

## 2014-10-03 NOTE — ED Notes (Signed)
Per EMS. Pt was here a week ago for a fall. Pt was diagnosed with PNA at that time. Spent a few nights in hospital then d/c back to nursing home [facility] with rx for moxifloxacin. Nursing home staff say pt is more sleepy than normal and has a decreased appetite since return from hospital. Pt normally ambulatory with walker.

## 2014-10-03 NOTE — ED Provider Notes (Signed)
CSN: [id number]     Arrival date & time 10/03/14  1318 History   First MD Initiated Contact with Patient 10/03/14 1346     Chief Complaint  Patient presents with  . decreased appetite, sleepy      (Consider location/radiation/quality/duration/timing/severity/associated sxs/prior Treatment) HPI Comments: Patient here from nursing home with altered mental status and lethargy. Patient recently treated for pneumonia and finished a course of moxifloxacin. Nursing home staff noted patient with decreased activity and appetite as his return from the hospital. Patient this time is not communicative and no further history can be obtained  The history is provided by medical records and the EMS personnel. The history is limited by the condition of the patient.    Past Medical History  Diagnosis Date  . Hypertension   . Constipation   . Cerebral vascular accident   . Uterine neoplasm   . Cough   . Insomnia   . Gastroesophageal reflux disease   . Allergic rhinitis   . Vitamin D deficiency    Past Surgical History  Procedure Laterality Date  . Foot surgery     Family History  Problem Relation Age of Onset  . Stroke Neg Hx   . Diabetes Mellitus II Neg Hx    Social History  Substance Use Topics  . Smoking status: Current Every Day Smoker  . Smokeless tobacco: None  . Alcohol Use: No     Comment: Quit.   OB History    No data available     Review of Systems  Unable to perform ROS     Allergies  Review of patient's allergies indicates no known allergies.  Home Medications   Prior to Admission medications   Medication Sig Start Date End Date Taking? Authorizing Provider  acetaminophen (TYLENOL) 325 MG tablet Take 650 mg by mouth every 6 (six) hours as needed for mild pain, fever or headache.   Yes Historical Provider, MD  aspirin 81 MG chewable tablet Chew 81 mg by mouth daily.   Yes Historical Provider, MD  cetirizine (ZYRTEC) 10 MG tablet Take 10 mg by mouth daily.   Yes  Historical Provider, MD  cholecalciferol (VITAMIN D) 400 UNITS TABS tablet Take 800 Units by mouth.   Yes Historical Provider, MD  ferrous sulfate 325 (65 FE) MG tablet Take 325 mg by mouth daily with breakfast.   Yes Historical Provider, MD  hydrALAZINE (APRESOLINE) 25 MG tablet Take 25 mg by mouth 3 (three) times daily.   Yes Historical Provider, MD  hydrocerin (EUCERIN) CREA Apply 1 application topically 2 (two) times daily. To legs and feet.   Yes Historical Provider, MD  metoprolol (LOPRESSOR) 100 MG tablet Take 100 mg by mouth 2 (two) times daily.   Yes Historical Provider, MD  moxifloxacin (AVELOX) 400 MG tablet Take 1 tablet (400 mg total) by mouth daily at 8 pm. Through 10/03/14 Patient taking differently: Take 400 mg by mouth daily at 8 pm.  09/28/14  Yes Delfina Redwood, MD  simvastatin (ZOCOR) 10 MG tablet Take 10 mg by mouth daily.   Yes Historical Provider, MD  sucralfate (CARAFATE) 1 G tablet Take 1 g by mouth 4 (four) times daily -  with meals and at bedtime.   Yes Historical Provider, MD   BP 162/67 mmHg  Pulse 66  Temp(Src) 98 F (36.7 C) (Oral)  Resp 18  SpO2 96% Physical Exam  Constitutional: She appears well-developed and well-nourished. She appears lethargic.  Non-toxic appearance. No distress.  HENT:  Head: Normocephalic and atraumatic.  Eyes: Conjunctivae, EOM and lids are normal. Pupils are equal, round, and reactive to light.  Neck: Normal range of motion. Neck supple. No tracheal deviation present. No thyroid mass present.  Cardiovascular: Normal rate, regular rhythm and normal heart sounds.  Exam reveals no gallop.   No murmur heard. Pulmonary/Chest: Effort normal. No stridor. No respiratory distress. She has decreased breath sounds. She has rhonchi. She has no rales.  Abdominal: Soft. Normal appearance and bowel sounds are normal. She exhibits no distension. There is no tenderness. There is no rebound and no CVA tenderness.  Musculoskeletal: Normal range of  motion. She exhibits no edema or tenderness.  Neurological: She appears lethargic. No cranial nerve deficit or sensory deficit. GCS eye subscore is 4. GCS verbal subscore is 5. GCS motor subscore is 4.  Withdraws to pain in all 4 extremities  Skin: Skin is warm and dry. No abrasion and no rash noted.  Nursing note and vitals reviewed.   ED Course  Procedures (including critical care time) Labs Review Labs Reviewed  URINALYSIS, ROUTINE W REFLEX MICROSCOPIC (NOT AT [facility])  CBC WITH DIFFERENTIAL/PLATELET  COMPREHENSIVE METABOLIC PANEL    Imaging Review No results found. I have personally reviewed and evaluated these images and lab results as part of my medical decision-making.   EKG Interpretation None      MDM   Final diagnoses:  None   patient here with elevated lactate and likely worsening HCAP. Started on vancomycin and Zosyn. We will admit to the medicine service    Lacretia Leigh, MD 10/03/14 1600

## 2014-10-03 NOTE — ED Notes (Addendum)
18:10 pt can go to floor.

## 2014-10-03 NOTE — ED Notes (Signed)
Tech attempted to draw blood, no success, RN notified

## 2014-10-03 NOTE — ED Notes (Signed)
Patient transported to Radiology 

## 2014-10-03 NOTE — ED Notes (Signed)
Patient transported to X-ray 

## 2014-10-04 ENCOUNTER — Inpatient Hospital Stay (HOSPITAL_COMMUNITY): Payer: Medicare Other

## 2014-10-04 ENCOUNTER — Encounter (HOSPITAL_COMMUNITY): Payer: Medicare Other

## 2014-10-04 DIAGNOSIS — Z86718 Personal history of other venous thrombosis and embolism: Secondary | ICD-10-CM

## 2014-10-04 DIAGNOSIS — R918 Other nonspecific abnormal finding of lung field: Secondary | ICD-10-CM

## 2014-10-04 DIAGNOSIS — I6200 Nontraumatic subdural hemorrhage, unspecified: Secondary | ICD-10-CM

## 2014-10-04 DIAGNOSIS — I82629 Acute embolism and thrombosis of deep veins of unspecified upper extremity: Secondary | ICD-10-CM

## 2014-10-04 DIAGNOSIS — J9601 Acute respiratory failure with hypoxia: Secondary | ICD-10-CM | POA: Diagnosis not present

## 2014-10-04 DIAGNOSIS — J948 Other specified pleural conditions: Secondary | ICD-10-CM

## 2014-10-04 DIAGNOSIS — R609 Edema, unspecified: Secondary | ICD-10-CM

## 2014-10-04 LAB — BASIC METABOLIC PANEL
Anion gap: 7 (ref 5–15)
BUN: 13 mg/dL (ref 6–20)
CHLORIDE: 102 mmol/L (ref 101–111)
CO2: 33 mmol/L — ABNORMAL HIGH (ref 22–32)
CREATININE: 0.78 mg/dL (ref 0.44–1.00)
Calcium: 8.1 mg/dL — ABNORMAL LOW (ref 8.9–10.3)
GFR calc Af Amer: >60 mL/min (ref >60)
GFR calc non Af Amer: >60 mL/min (ref >60)
Glucose, Bld: 89 mg/dL (ref 65–99)
Potassium: 3.2 mmol/L — ABNORMAL LOW (ref 3.5–5.1)
SODIUM: 142 mmol/L (ref 135–145)

## 2014-10-04 LAB — CBC
HCT: 40.1 % (ref 36.0–46.0)
Hemoglobin: 12.4 g/dL (ref 12.0–15.0)
MCH: 27 pg (ref 26.0–34.0)
MCHC: 30.9 g/dL (ref 30.0–36.0)
MCV: 87.2 fL (ref 78.0–100.0)
PLATELETS: 260 10*3/uL (ref 150–400)
RBC: 4.6 MIL/uL (ref 3.87–5.11)
RDW: 15.4 % (ref 11.5–15.5)
WBC: 5.6 10*3/uL (ref 4.0–10.5)

## 2014-10-04 LAB — LEGIONELLA ANTIGEN, URINE

## 2014-10-04 LAB — HIV ANTIBODY (ROUTINE TESTING W REFLEX): HIV Screen 4th Generation wRfx: NONREACTIVE

## 2014-10-04 MED ORDER — FERROUS SULFATE 325 (65 FE) MG PO TABS
325.0000 mg | ORAL_TABLET | Freq: Every day | ORAL | Status: DC
  Administered 2014-10-04 – 2014-10-07 (×3): 325 mg via ORAL
  Filled 2014-10-04 (×4): qty 1

## 2014-10-04 MED ORDER — HYDRALAZINE HCL 25 MG PO TABS
25.0000 mg | ORAL_TABLET | Freq: Three times a day (TID) | ORAL | Status: DC
  Administered 2014-10-04 – 2014-10-06 (×5): 25 mg via ORAL
  Filled 2014-10-04 (×5): qty 1

## 2014-10-04 MED ORDER — CHLORHEXIDINE GLUCONATE 0.12 % MT SOLN
15.0000 mL | Freq: Two times a day (BID) | OROMUCOSAL | Status: DC
  Administered 2014-10-04 – 2014-10-07 (×6): 15 mL via OROMUCOSAL
  Filled 2014-10-04 (×7): qty 15

## 2014-10-04 MED ORDER — SIMVASTATIN 10 MG PO TABS
10.0000 mg | ORAL_TABLET | Freq: Every day | ORAL | Status: DC
  Administered 2014-10-04 – 2014-10-07 (×4): 10 mg via ORAL
  Filled 2014-10-04 (×4): qty 1

## 2014-10-04 MED ORDER — LORATADINE 10 MG PO TABS
10.0000 mg | ORAL_TABLET | Freq: Every day | ORAL | Status: DC
Start: 2014-10-04 — End: 2014-10-07
  Administered 2014-10-04 – 2014-10-07 (×4): 10 mg via ORAL
  Filled 2014-10-04 (×4): qty 1

## 2014-10-04 MED ORDER — METOPROLOL TARTRATE 50 MG PO TABS
100.0000 mg | ORAL_TABLET | Freq: Two times a day (BID) | ORAL | Status: DC
  Administered 2014-10-04 – 2014-10-06 (×5): 100 mg via ORAL
  Filled 2014-10-04 (×6): qty 2

## 2014-10-04 MED ORDER — ASPIRIN 81 MG PO CHEW
81.0000 mg | CHEWABLE_TABLET | Freq: Every day | ORAL | Status: DC
  Administered 2014-10-04 – 2014-10-07 (×4): 81 mg via ORAL
  Filled 2014-10-04 (×4): qty 1

## 2014-10-04 MED ORDER — CETYLPYRIDINIUM CHLORIDE 0.05 % MT LIQD
7.0000 mL | Freq: Two times a day (BID) | OROMUCOSAL | Status: DC
  Administered 2014-10-04 – 2014-10-05 (×4): 7 mL via OROMUCOSAL

## 2014-10-04 MED ORDER — POTASSIUM CHLORIDE CRYS ER 20 MEQ PO TBCR: 40.0000 meq | EXTENDED_RELEASE_TABLET | ORAL | Status: DC | PRN

## 2014-10-04 MED ORDER — HYDROCERIN EX CREA
1.0000 "application " | TOPICAL_CREAM | Freq: Two times a day (BID) | CUTANEOUS | Status: DC
  Administered 2014-10-04 – 2014-10-07 (×7): 1 via TOPICAL
  Filled 2014-10-04: qty 113

## 2014-10-04 NOTE — Progress Notes (Signed)
Initial Nutrition Assessment  DOCUMENTATION CODES:   Not applicable  INTERVENTION:  - Continue Ensure Enlive BID, each supplement provides 350 kcal and 20 grams of protein - Encourage intakes of meals and supplement - RD will continue to monitor for needs  NUTRITION DIAGNOSIS:   Predicted suboptimal nutrient intake related to acute illness, poor appetite as evidenced by per patient/family report.  GOAL:   Patient will meet greater than or equal to 90% of their needs  MONITOR:   PO intake, Supplement acceptance, Weight trends, Labs, I & O's  REASON FOR ASSESSMENT:   Malnutrition Screening Tool  ASSESSMENT:   81 y.o. female with hypertension who was admitted from 8/22 through 8/24 after a fall and right subdural hematoma. Neurosurgery recommended observation. She was noted to be hypoxic with a chest x-ray showing right lower lobe infiltrate and was started on Avelox prior to discharge.81 y.o. female with hypertension who was admitted from 8/22 through 8/24 after a fall and right subdural hematoma. Neurosurgery recommended observation. She was noted to be hypoxic with a chest x-ray showing right lower lobe infiltrate and was started on Avelox prior to discharge.  Pt seen for MST. BMI indicates normal weight status. Diet advanced from NPO to Regular at 0958 today and breakfast tray in the room untouched; pt reports she is not feeling hungry and refuses meal even with encouragement. She denies abdominal pain or nausea.  Pt does not answer most of the questions asked by RD. She refuses physical assessment to be done at this time. Suspect some degree of malnutrition but unable to confirm at this time.  Per chart review, pt has lost 8 lbs (7% body weight) in the past 5 days which is significant for time frame.  Not meeting needs. Medications reviewed. Labs reviewed; K: 3.2 mmol/L, Ca: 8.1 mg/dL.   Diet Order:  Diet regular Room service appropriate?: Yes; Fluid consistency::  Thin  Skin:  Reviewed, no issues  Last BM:  PTA  Height:   Ht Readings from Last 1 Encounters:  10/03/14 5\' 1"  (1.549 m)    Weight:   Wt Readings from Last 1 Encounters:  10/03/14 100 lb 15.5 oz (45.8 kg)    Ideal Body Weight:  47.73 kg (kg)  BMI:  Body mass index is 19.09 kg/(m^2).  Estimated Nutritional Needs:   Kcal:  1300-1500  Protein:  45-55 grams  Fluid:  1.8-2.2 L/day  EDUCATION NEEDS:   No education needs identified at this time     Jarome Matin, RD, LDN Inpatient Clinical Dietitian Pager # [phone] After hours/weekend pager # [phone]

## 2014-10-04 NOTE — Progress Notes (Addendum)
[facility] Progress Note   Debra York  QMV:[id number]  DOB: 04/09/1924  DOA: 10/03/2014 PCP: Reymundo Poll, MD  Brief narrative: Debra York is a 81 y.o. female with CVA with left-sided weakness/contracted extremities, hypertension who was admitted from 8/22 through 8/24 after a fall and right subdural hematoma. Neurosurgery recommended observation for the subdural hematoma. She was noted to be hypoxic with a chest x-ray showing right lower lobe infiltrate and was started on Avelox prior to discharge. She returns from the nursing facility due to confusion. She's had a CT of the head which shows a nearly resolved right subdural hematoma. She continues to be hypoxic and is requiring 3 L of oxygen. A CT scan of the chest reveals a right sided effusion and a spiculated right middle lobe mass. She is also noted to have left hand swelling which I am unable to tell is acute or chronic. Venous duplex ordered   Subjective: Alert today. Can tell me her name. Unable to tell me where she is or what year it is. No complaints. Per RN, she is not eating or drinking.   Assessment/Plan: Principal Problem:   Acute respiratory failure with hypoxemia/  Lung mass/  Pleural effusion,    (A) underlying HCAP?- was recently discharged with Avelox-  Started on treatment with vancomycin and Zosyn as she had acute onset confusion, lactic acid elevation and a cough prior to being discharged from the hospital last week-mental status and lactic acid have improved-would continue antibiotics for 5-7 days  (B) most likely has lung cancer with a malignant effusion- unable to discuss with patient as she is confused. I have tried calling the number on the chart but it is not an active number. We have called her nursing facility and there are no other contacts listed. I would not be aggressive in working this up as she is not a candidate for treatment. Recommend discharge back to facility with O2 if needed.  No need to do therapeutic thoracentesis as she is quite comfortable.  I have asked palliative care to address goals of care if a family member can be found.  CODE STATUS is DO NOT RESUSCITATE  Active Problems: Acute encephalopathy - see above - slow IVF as she is not eating yet    Subdural hematoma -Due to fall last week -it is resolving  Left arm brachial DVT - venous duplex today: Left upper extremity is positive for indeterminate age deep vein thrombosis involving the a single left brachial vein - due to subdural, unable to anticoagulate- also, age undetermined- could be chronic as her extremity is contracted    Hypertensive urgency - resolved with one dose of Hydralazine in ER - as she is more alert today, will resume oral Hydralazine and Metoprolol  CVA  - with left sided weakness, left facial droop - cont ASA and statin    Code Status:     Code Status Orders        Start     Ordered   10/03/14 1714  Do not attempt resuscitation (DNR)   Continuous    Question Answer Comment  In the event of cardiac or respiratory ARREST Do not call a "code blue"   In the event of cardiac or respiratory ARREST Do not perform Intubation, CPR, defibrillation or ACLS   In the event of cardiac or respiratory ARREST Use medication by any route, position, wound care, and other measures to relive pain and suffering. May use oxygen, suction and manual treatment of airway  obstruction as needed for comfort.      10/03/14 1714     Family Communication: unable to reach cousin who is listed as contact person Disposition Plan: return to SNF with hospice DVT prophylaxis: Lovenox Consultants:palliative care Procedures:   Antibiotics: Anti-infectives    Start     Dose/Rate Route Frequency Ordered Stop   10/04/14 1800  vancomycin (VANCOCIN) 500 mg in sodium chloride 0.9 % 100 mL IVPB     500 mg 100 mL/hr over 60 Minutes Intravenous Every 24 hours 10/03/14 1742     10/04/14 0000   piperacillin-tazobactam (ZOSYN) IVPB 3.375 g     3.375 g 12.5 mL/hr over 240 Minutes Intravenous 3 times per day 10/03/14 1858     10/03/14 1715  piperacillin-tazobactam (ZOSYN) IVPB 3.375 g  Status:  Discontinued     3.375 g 100 mL/hr over 30 Minutes Intravenous 3 times per day 10/03/14 1714 10/03/14 1857   10/03/14 1600  piperacillin-tazobactam (ZOSYN) IVPB 3.375 g     3.375 g 12.5 mL/hr over 240 Minutes Intravenous  Once 10/03/14 1559 10/03/14 2010   10/03/14 1600  vancomycin (VANCOCIN) IVPB 1000 mg/200 mL premix     1,000 mg 200 mL/hr over 60 Minutes Intravenous  Once 10/03/14 1559 10/03/14 1830      Objective: Filed Weights   10/03/14 1900  Weight: 45.8 kg (100 lb 15.5 oz)    Intake/Output Summary (Last 24 hours) at 10/04/14 1244 Last data filed at 10/04/14 0600  Gross per 24 hour  Intake    875 ml  Output    151 ml  Net    724 ml     Vitals Filed Vitals:   10/03/14 1745 10/03/14 1900 10/03/14 2120 10/04/14 0527  BP: 143/90 153/55 115/72 147/65  Pulse: 65 71 60 67  Temp:  98.2 F (36.8 C) 98.1 F (36.7 C) 98.2 F (36.8 C)  TempSrc:  Oral Oral Oral  Resp: 24 24 22 20   Height:  5\' 1"  (1.549 m)    Weight:  45.8 kg (100 lb 15.5 oz)    SpO2: 100% 93% 92% 94%    Exam:  General:  Pt is alert/ confused but not restless, not in acute distress  HEENT: No icterus, No thrush, oral mucosa moist  Cardiovascular: regular rate and rhythm, S1/S2 No murmur  Respiratory: clear to auscultation in left lung field, decreased breath sounds in R lung field  Abdomen: Soft, +Bowel sounds, non tender, non distended, no guarding  MSK: No cyanosis or clubbing- left hand swollen, non-tender  Data Reviewed: Basic Metabolic Panel:  Recent Labs Lab 09/28/14 0542 10/03/14 1420 10/04/14 0830  NA 140 143 142  K 3.3* 3.5 3.2*  CL 97* 101 102  CO2 31 35* 33*  GLUCOSE 75 116* 89  BUN 12 18 13   CREATININE 1.00 1.10* 0.78  CALCIUM 8.6* 8.9 8.1*   Liver Function  Tests:  Recent Labs Lab 10/03/14 1420  AST 34  ALT 17  ALKPHOS 77  BILITOT 0.9  PROT 6.8  ALBUMIN 3.1*   No results for input(s): LIPASE, AMYLASE in the last 168 hours. No results for input(s): AMMONIA in the last 168 hours. CBC:  Recent Labs Lab 10/03/14 1420 10/04/14 0830  WBC 6.6 5.6  NEUTROABS 4.4  --   HGB 14.3 12.4  HCT 45.8 40.1  MCV 87.1 87.2  PLT 253 260   Cardiac Enzymes:  Recent Labs Lab 10/03/14 1601  TROPONINI 0.31*   BNP (last 3 results)  Recent  Labs  09/26/14 2216 10/03/14 1601  BNP 1808.5* 1270.2*    ProBNP (last 3 results) No results for input(s): PROBNP in the last 8760 hours.  CBG: No results for input(s): GLUCAP in the last 168 hours.  Recent Results (from the past 240 hour(s))  MRSA PCR Screening     Status: None   Collection Time: 10/03/14  8:18 PM  Result Value Ref Range Status   MRSA by PCR NEGATIVE NEGATIVE Final    Comment:        The GeneXpert MRSA Assay (FDA approved for NASAL specimens only), is one component of a comprehensive MRSA colonization surveillance program. It is not intended to diagnose MRSA infection nor to guide or monitor treatment for MRSA infections.      Studies: Dg Chest 2 View  10/03/2014   CLINICAL DATA:  Hypertension, congestion, decreased appetite.  EXAM: CHEST  2 VIEW  COMPARISON:  September 26, 2014  FINDINGS: The heart size and mediastinal contours are stable. The heart size is enlarged. The aorta is tortuous. There is a 2.5 cm masslike lesion in the right mid to lung base. There is a question 2.8 cm mass in the left mid to lung base. There is central pulmonary vascular congestion. There is patchy consolidation of the left lung base. There are bilateral posterior pleural effusions. The visualized skeletal structures are stable.  IMPRESSION: There is a 2.5 cm masslike lesion in the right lateral mid to lung base. Question 2.8 cm mass in the left lung. Further evaluation with chest CT is recommended.   Consolidation of left lung base with left pleural effusion. This at least in part due to atelectasis but underlying pneumonia is not excluded.   Electronically Signed   By: Abelardo Diesel M.D.   On: 10/03/2014 15:03   Ct Head Wo Contrast  10/03/2014   CLINICAL DATA:  Altered mental status.  EXAM: CT HEAD WITHOUT CONTRAST  TECHNIQUE: Contiguous axial images were obtained from the base of the skull through the vertex without contrast.  COMPARISON:  09/27/2014 and 09/26/1958  FINDINGS: There is minimal residual extra-axial blood along the right frontal-parietal convexity. Remaining blood products are best seen on sequence 5, image 5. Again noted is encephalomalacia throughout the right cerebral hemisphere with ex vacuo dilatation of the right lateral ventricle. There is no significant midline shift. No evidence for an acute hemorrhage, mass lesion or new large infarct. Mild soft tissue swelling in the left periorbital region.  Postsurgical changes related to a right temporal craniotomy and aneurysm clip near the right middle cranial fossa. Evidence for a large polyp or retention cyst in the right maxillary sinus.  IMPRESSION: No acute intracranial abnormality.  Right subdural hematoma has nearly resolved.  Chronic encephalomalacia in the right cerebral cortex with postsurgical changes.   Electronically Signed   By: Markus Daft M.D.   On: 10/03/2014 15:28   Ct Chest W Contrast  10/03/2014   CLINICAL DATA:  Shortness of breath.  Possible right lung mass.  EXAM: CT CHEST WITH CONTRAST  TECHNIQUE: Multidetector CT imaging of the chest was performed during intravenous contrast administration.  CONTRAST:  9mL OMNIPAQUE IOHEXOL 300 MG/ML  SOLN  COMPARISON:  Chest x-ray dated 10/03/2014  FINDINGS: There is no evidence of pulmonary embolus. The heart size seems enlarged. There is no pericardial effusion. There are heavy atherosclerotic calcifications of the aorta.  There is a 1.2 cm right precarinal lymph node with  rounded appearance. The esophagus is dilated for most of its  course.  There is a spiculated soft tissue mass within the lateral aspect of the right middle lobe which measures 2.5 by 1.8 by 2.0 cm, concerning for primary lung malignancy. Spiculations extend into the lateral pleural and interlobar fissure planes. There is also a nodular subpleural scarring in the right lower lobe causing tenting measuring 1.8 by 1.4 cm. Ground-glass opacities are seen within the right lower lobe. There are bilateral pleural effusion with subsegmental bibasilar atelectasis. No axillary lymphadenopathy is seen. The visualized portions of the thyroid gland are unremarkable in appearance.  The visualized portions of the stomach, liver and spleen are unremarkable.  No acute osseous abnormalities are seen.  IMPRESSION: Spiculated right middle lobe mass, concerning for primary lung malignancy.  Nodular right lower lobe subpleural thickening. This may represent scarring versus a second mass.  Indeterminate right precarinal lymph node.  Bilateral pleural effusions with subsegmental atelectasis.  PET-CT or tissue sampling may be considered for further diagnosis.   Electronically Signed   By: Fidela Salisbury M.D.   On: 10/03/2014 15:58   Dg Shoulder Left Port  10/03/2014   CLINICAL DATA:  History of fall.  Altered mental status.  EXAM: LEFT SHOULDER - 1 VIEW  COMPARISON:  None.  FINDINGS: There is no evidence of fracture. However the humeral head is positioned anteriorly to the glenoid on the scapular Y-view. It is uncertain whether this is due to difficulty in positioning or represents anterior left shoulder dislocation. There is soft tissue swelling overlying the left shoulder.  Cardiac silhouette is enlarged.  Vascular calcifications are noted.  IMPRESSION: Humeral head positioned anterior to the glenoid on the scapular Y-view. It is uncertain whether this is due to difficulties in positioning, or represents a true anterior left  shoulder dislocation. Correlation with clinical exam is recommended. If clinical concern for anterior shoulder dislocation, repeated scapular Y-view and axillary view are recommended.   Electronically Signed   By: Fidela Salisbury M.D.   On: 10/03/2014 18:17    Scheduled Meds:  Scheduled Meds: . antiseptic oral rinse  7 mL Mouth Rinse q12n4p  . chlorhexidine  15 mL Mouth Rinse BID  . enoxaparin (LOVENOX) injection  30 mg Subcutaneous Q24H  . feeding supplement (ENSURE ENLIVE)  237 mL Oral BID BM  . piperacillin-tazobactam (ZOSYN)  IV  3.375 g Intravenous 3 times per day  . vancomycin  500 mg Intravenous Q24H   Continuous Infusions: . sodium chloride 20 mL/hr at 10/03/14 1610  . sodium chloride 75 mL/hr at 10/04/14 0552    Time spent on care of this patient: 35 min   [place], MD 10/04/2014, 12:44 PM  LOS: 1 day   [facility] Office  [phone] Pager - Text Page per [email or web address] If 7PM-7AM, please contact night-coverage [email or web address]

## 2014-10-04 NOTE — Clinical Social Work Note (Signed)
Clinical Social Work Assessment  Patient Details  Name: Debra York MRN: [id number] Date of Birth: [birth year]  Date of referral:  10/04/14               Reason for consult:  Discharge Planning                Permission sought to share information with:  Facility Sport and exercise psychologist Permission granted to share information::     Name::     Ms. Debra York   Agency::     Relationship::  RN at [facility]:  [phone]  Housing/Transportation Living arrangements for the past 2 months:  [place] of Information:  Facility Patient Interpreter Needed:  None Criminal Activity/Legal Involvement Pertinent to Current Situation/Hospitalization:  No - Comment as needed Significant Relationships:  Other(Comment) Librarian, academic) Lives with:  Facility Resident Do you feel safe going back to the place where you live?  Yes (pt unable to participate, but ALF states that pt can return) Need for family participation in patient care:  Yes (Comment) (pt family contact phone number not an active number-Ms. Debra York  at [facility] contacted and states that pt does not have direct family contact)  Care giving concerns:  Pt admitted from [facility].    Social Worker assessment / plan:  CSW received referral that pt admitted from [facility].   CSW met the pt at the bedside. Given the pt's orientation level the CSW could not gather assessment information. CSW called the pt's emergency contact, but the number is not active. CSW called [facility]. CSW spoke with [place] confirmed that the pt is a resident at their ALF. Ms. Debra York reported that since pt returned from previous hospital stay that pt has been more "slugglish" and has barely been eating. [place] RN Debra Debra York, stated that facility does not have contact for pt family. [place] RN Debra Debra York stated that pt cousins  visited recently, but did not leave their contact information with the facility. CSW updated [place] on pt current medical condition. [place] stated that she will  she will need to review the pt's clinical, but anticipate facility can accept pt back and states that if pt transitions to comfort that facility is agreeable to accepting pt back with hospice of [place] to follow. CSW to fax clinical information to Ms. York. CSW will continue to follow this pt and assist with discharge as needed.   Employment status:  Retired Forensic scientist:  Information systems manager, Medicaid In [place] PT Recommendations:  Not assessed at this time [place] / Referral to community resources:  Other (Comment Required) (Referral back to [facility])  Patient/Family's Response to care:  Pt unable participate in assessment secondary to confusion.   Patient/Family's Understanding of and Emotional Response to Diagnosis, Current Treatment, and Prognosis:  Unable to assess.   Emotional Assessment Appearance:  Appears stated age Attitude/Demeanor/Rapport:  Unable to Assess Affect (typically observed):  Unable to Assess Orientation:  Oriented to Self, Oriented to Place Alcohol / Substance use:  Not Applicable Psych involvement (Current and /or in the community):  No (Comment)  Discharge Needs  Concerns to be addressed:  Discharge Planning Concerns Readmission within the last 30 days:  Yes Current discharge risk:  None Barriers to Discharge:  No Barriers Identified   [place], [place], LCSW 10/04/2014, 2:39 PM  [phone]

## 2014-10-04 NOTE — Progress Notes (Signed)
I spoke with Hughes Better, [place] who is assisting with finding next of kin/surrogate decision maker. Will be happy to discuss [place] when surrogate has been found to participate in discussions. Will continue to follow along - thank you for this consult.   Vinie Sill, NP Palliative Medicine Team Pager # [phone] (M-F 8a-5p) Team Phone # [phone] (Nights/Weekends)

## 2014-10-04 NOTE — Progress Notes (Signed)
*  Preliminary Results* Left upper extremity venous duplex completed. Study was technically difficult due to poor patient cooperation. Left upper extremity is positive for indeterminate age deep vein thrombosis involving the a single left brachial vein.  Preliminary results discussed with [place], RN.  10/04/2014 11:15 AM  Maudry Mayhew, RVT, RDCS, RDMS

## 2014-10-05 DIAGNOSIS — J9601 Acute respiratory failure with hypoxia: Principal | ICD-10-CM

## 2014-10-05 DIAGNOSIS — Z515 Encounter for palliative care: Secondary | ICD-10-CM

## 2014-10-05 MED ORDER — MORPHINE SULFATE (PF) 2 MG/ML IV SOLN
0.5000 mg | INTRAVENOUS | Status: DC | PRN
  Administered 2014-10-05 – 2014-10-07 (×3): 0.5 mg via INTRAVENOUS
  Filled 2014-10-05 (×4): qty 1

## 2014-10-05 MED ORDER — POTASSIUM CHLORIDE CRYS ER 20 MEQ PO TBCR
40.0000 meq | EXTENDED_RELEASE_TABLET | Freq: Once | ORAL | Status: AC
  Administered 2014-10-05: 40 meq via ORAL
  Filled 2014-10-05: qty 2

## 2014-10-05 NOTE — Consult Note (Signed)
Consultation Note Date: 10/05/2014   Patient Name: Debra York  DOB: 11-25-1924  MRN: [id number]  Age / Sex: 81 y.o., female   PCP: Reymundo Poll, MD Referring Physician: Theodis Blaze, MD  Reason for Consultation: [place] Assessment and Plan Summary of Established Goals of Care and Medical Treatment Preferences    Palliative Care Discussion Held Today:   Debra York is lying in bed. She complains of pain in her hip. She says she doesn't feel good today - she gives short yes/no answers. She also is difficult to understand when she talks more. She says she just wants to sleep. Still confused.   I was able to reach her family and spoke with Debra York, her cousin's wife via telephone. Debra York says that her husband is 21 yo and has his own health issues but tries to visit with her when he is able. I updated Debra York on Debra York's pneumonia and concern about her lung mass for lung cancer. I explained that given her age and recent decline aggressive treatment (and therefore invasive testing) will not be very beneficial to put her through. Debra York was very understanding and I did tell her that our recommendation would be consideration of hospice. Debra York will speak with her husband and they will visit tomorrow and I will meet with them to discuss plan further.    Contacts/Participants in Discussion: Primary Decision Maker: Cousin - Debra York  Goals of Care/Code Status/Advance Care Planning:   Code Status: DNR   Symptom Management:   Pain: Tylenol did not relieve pain. Morphine 0.5 mg IV every 4 hours prn - may increase as needed.   Psycho-social/Spiritual:   Support System: Poor - family/cousin is supportive but limited by his health issues.  Prognosis: Very poor - weeks to months with poor intake and likely cancer diagnosis.   Discharge Planning: To be determined. Recommend return to [facility] with hospice.        Chief Complaint/HPI: 81 yo female with  PMH of stroke and recent hospitalization 8/22-8/24 for fall with right subdural hematoma. Admitted with lethargy and decreased intake and found to have pneumonia and right lung mass suspicious for lung cancer.   Primary Diagnoses  Present on Admission:  . Lung mass . Acute respiratory failure with hypoxemia . Subdural hematoma  Palliative Review of Systems:   + hip pain (believed to be right side)   I have reviewed the medical record, interviewed the patient and family, and examined the patient. The following aspects are pertinent.  Past Medical History  Diagnosis Date  . Hypertension   . Constipation   . Cerebral vascular accident   . Uterine neoplasm   . Cough   . Insomnia   . Gastroesophageal reflux disease   . Allergic rhinitis   . Vitamin D deficiency    Social History   Social History  . Marital Status: Widowed    Spouse Name: N/A  . Number of Children: N/A  . Years of Education: N/A   Social History Main Topics  . Smoking status: Current Every Day Smoker  . Smokeless tobacco: Never Used  . Alcohol Use: No     Comment: Quit.  . Drug Use: No  . Sexual Activity: No   Other Topics Concern  . None   Social History Narrative   Family History  Problem Relation Age of Onset  . Stroke Neg Hx   . Diabetes Mellitus II Neg Hx    Scheduled Meds: . antiseptic oral rinse  7 mL Mouth Rinse q12n4p  . aspirin  81 mg Oral Daily  . chlorhexidine  15 mL Mouth Rinse BID  . enoxaparin (LOVENOX) injection  30 mg Subcutaneous Q24H  . feeding supplement (ENSURE ENLIVE)  237 mL Oral BID BM  . ferrous sulfate  325 mg Oral Q breakfast  . hydrALAZINE  25 mg Oral TID  . hydrocerin  1 application Topical BID  . loratadine  10 mg Oral Daily  . metoprolol  100 mg Oral BID  . piperacillin-tazobactam (ZOSYN)  IV  3.375 g Intravenous 3 times per day  . simvastatin  10 mg Oral Daily  . vancomycin  500 mg Intravenous Q24H   Continuous Infusions: . sodium chloride 20 mL/hr at  10/03/14 1610  . sodium chloride 75 mL/hr at 10/05/14 0636   PRN Meds:.acetaminophen **OR** acetaminophen, alum & mag hydroxide-simeth, hydrALAZINE, ondansetron **OR** ondansetron (ZOFRAN) IV, potassium chloride Medications Prior to Admission:  Prior to Admission medications   Medication Sig Start Date End Date Taking? Authorizing Provider  acetaminophen (TYLENOL) 325 MG tablet Take 650 mg by mouth every 6 (six) hours as needed for mild pain, fever or headache.   Yes Historical Provider, MD  aspirin 81 MG chewable tablet Chew 81 mg by mouth daily.   Yes Historical Provider, MD  cetirizine (ZYRTEC) 10 MG tablet Take 10 mg by mouth daily.   Yes Historical Provider, MD  cholecalciferol (VITAMIN D) 400 UNITS TABS tablet Take 800 Units by mouth.   Yes Historical Provider, MD  ferrous sulfate 325 (65 FE) MG tablet Take 325 mg by mouth daily with breakfast.   Yes Historical Provider, MD  hydrALAZINE (APRESOLINE) 25 MG tablet Take 25 mg by mouth 3 (three) times daily.   Yes Historical Provider, MD  hydrocerin (EUCERIN) CREA Apply 1 application topically 2 (two) times daily. To legs and feet.   Yes Historical Provider, MD  metoprolol (LOPRESSOR) 100 MG tablet Take 100 mg by mouth 2 (two) times daily.   Yes Historical Provider, MD  moxifloxacin (AVELOX) 400 MG tablet Take 1 tablet (400 mg total) by mouth daily at 8 pm. Through 10/03/14 Patient taking differently: Take 400 mg by mouth daily at 8 pm.  09/28/14  Yes Delfina Redwood, MD  simvastatin (ZOCOR) 10 MG tablet Take 10 mg by mouth daily.   Yes Historical Provider, MD  sucralfate (CARAFATE) 1 G tablet Take 1 g by mouth 4 (four) times daily -  with meals and at bedtime.   Yes Historical Provider, MD   No Known Allergies CBC:    Component Value Date/Time   WBC 5.6 10/04/2014 0830   HGB 12.4 10/04/2014 0830   HCT 40.1 10/04/2014 0830   PLT 260 10/04/2014 0830   MCV 87.2 10/04/2014 0830   NEUTROABS 4.4 10/03/2014 1420   LYMPHSABS 1.0 10/03/2014  1420   MONOABS 1.0 10/03/2014 1420   EOSABS 0.2 10/03/2014 1420   BASOSABS 0.0 10/03/2014 1420   Comprehensive Metabolic Panel:    Component Value Date/Time   NA 142 10/04/2014 0830   K 3.2* 10/04/2014 0830   CL 102 10/04/2014 0830   CO2 33* 10/04/2014 0830   BUN 13 10/04/2014 0830   CREATININE 0.78 10/04/2014 0830   GLUCOSE 89 10/04/2014 0830   CALCIUM 8.1* 10/04/2014 0830   AST 34 10/03/2014 1420   ALT 17 10/03/2014 1420   ALKPHOS 77 10/03/2014 1420   BILITOT 0.9 10/03/2014 1420   PROT 6.8 10/03/2014 1420   ALBUMIN 3.1* 10/03/2014 1420  Physical Exam:  Vital Signs: BP 146/80 mmHg  Pulse 72  Temp(Src) 98.2 F (36.8 C) (Oral)  Resp 20  Ht 5\' 1"  (1.549 m)  Wt 45.8 kg (100 lb 15.5 oz)  BMI 19.09 kg/m2  SpO2 100% SpO2: SpO2: 100 % O2 Device: O2 Device: Not Delivered O2 Flow Rate: O2 Flow Rate (L/min): 3 L/min Intake/output summary:  Intake/Output Summary (Last 24 hours) at 10/05/14 1315 Last data filed at 10/05/14 1030  Gross per 24 hour  Intake 2202.5 ml  Output      0 ml  Net 2202.5 ml   LBM: Last BM Date: 10/04/14 Baseline Weight: Weight: 45.8 kg (100 lb 15.5 oz) Most recent weight: Weight: 45.8 kg (100 lb 15.5 oz)  Exam Findings:   General: NAD, thin, frail HEENT: Temporal muscle wasting Resp: No labored breathing Abd: Soft, NT, ND Neuro:  Awake, alert, oriented to person only           Palliative Performance Scale: 20%                Additional Data Reviewed: Recent Labs     10/03/14  1420  10/04/14  0830  WBC  6.6  5.6  HGB  14.3  12.4  PLT  253  260  NA  143  142  BUN  18  13  CREATININE  1.10*  0.78     Time In: 1230 Time Out: 1330 Time Total: 25min  Greater than 50%  of this time was spent counseling and coordinating care related to the above assessment and plan.  Discussed plan of care with Dr. Doyle Askew.   Signed by:  Vinie Sill, NP Palliative Medicine Team Pager # [phone] (M-F 8a-5p) Team Phone # [phone]  (Nights/Weekends)

## 2014-10-05 NOTE — Progress Notes (Addendum)
CSW continuing to follow.   CSW continuing attempts to locate next of kin for pt.   CSW spoke to [facility] and facility stated that pt signed herself into the facility and has been her own decision maker at the facility. [facility] stated that pt has not had family visit at the facility since 2015 and the only contact that facility has is the same that is listed on pt emergency contact page for [facility].   CSW attempted to call the emergency phone number list for [facility], but phone number has been disconnected.  CSW attempted to call phone number listed for pt that is not the number of the facility ([phone]) and this number is disconnected.  CSW met with pt at bedside. Pt alert and oriented x 2. Pt was able to tell CSW that Elowen Debruyn is her only living cousin. Pt states that Arnell Sieving lives in [place], but pt does not know a phone number for Arnell Sieving. Pt states that Arthur's middle name is Therapist, art.  CSW contacted non-emergency police to seek assistance with locating pt cousin, Arnell Sieving. CSW provided non-emergency police with the information CSW had. Non-Emergency police was able to locate an address and is dispatching an officer for a location check to determine if it is the residence of Ashyla Luth and if the individual is the person related to the pt. Non-emergency police dispatcher took this [place] contact information and stated that an officer will contact me to notify the outcome of the location check.  CSW to continue to follow to attempt to locate pt next of kin. CSW to continue to update [facility].   Addendum 11:26 am:  CSW received phone call from Delsa Walder who is Fatim Vanderschaaf (first cousin of patient). Non-emergency police was able to locate pt family and provide pt family with this CSW contact information. Arnell Sieving and Marissa Calamity home phone number is [phone] and Lilie Vezina stated that she also has a  cell phone is [phone]. CSW provided pt family with pt location in the hospital and discussed with Ms. Mckeen that MD will contact pt family to provide more of an update on pt medical condition.  CSW updated [facility].   CSW to continue to follow to provide support and assist with pt disposition planning as appropriate.  Alison Murray, MSW, [place] [facility] [phone]

## 2014-10-05 NOTE — Progress Notes (Signed)
Patient ID: Debra York, female   DOB: 07/17/1924, 81 y.o.   MRN: [id number]  [facility] PROGRESS NOTE  Tatiyana Foucher JAS:[id number] DOB: 08/08/1924 DOA: 10/03/2014 PCP: Reymundo Poll, MD   Brief narrative:    Patient is 81 year old female with known history of CVA and left-sided weakness with contracted extremities at baseline, hypertension, admitted from August 22 through August 24 after fall and right subdural hematoma. Neurosurgery at that time recommended observation. On previous hospitalization patient also treated for right lower lobe pneumonia and was discharged on Avelox. She was discharged to skilled nursing facility and now presented back to [facility] emergency department for evaluation of persistent confusion. Repeat CT head noted resolved right subdural hematoma and since patient continued to remain hypoxic requiring 3 L of oxygen, CT chest required and revealed right-sided effusion, questionable right middle lobe mass. TR[facility] asked to admit for further evaluation.  Assessment/Plan:    Principal Problem:   Acute respiratory failure with hypoxemia - ? HCAP, right middle lobe PNA, right pleural effusion, ? Lung cancer and malignant effusion - Placed on broad-spectrum anti-biotic vancomycin and Zosyn - We'll continue same regimen with anti-biotics for now and plan on transitioning to by mouth in next 24-48 hours - Regarding possible lung cancer, palliative care team consulted to address goals of care prior to any further workup  Active Problems:   Acute encephalopathy - Possibly related to acute illness imposed on progressive FTT, hx of stroke, recent subdural hematoma - mental status better this AM but still confused  - treating PNA, will attempt PT evaluation - PCT consulted for goals of care    Subdural hematoma - per repeat CT head, resolved    Lung mass, RML and ? RLL  - will follow up on PCT discussions prior to further work up     Hypertensive  urgency - BP more stable this AM - continue hydralazine and metoprolol    Hypokalemia - supplement and repeat BMP in AM    Acute renal failure - pre renal in etiology - IVF have been provided and Cr is now WNL    Elevated troponin on 8/29 - possible from demand ischemia  - unable to [facility] due to subdural hematoma - PCT consulted - pt is not candidate for cardiac intervention - no need to cycle enzymes as pt denies chest pain     Left arm brachial DVT - venous duplex today: LUE + for indeterminate age deep vein thrombosis involving the a single left brachial vein - due to subdural, unable to anticoagulate     CVA  - with left sided weakness, left facial droop - cont ASA and statin    Protein calorie malnutrition - moderate, nutritionist consulted   DVT prophylaxis - SCD's  Code Status: DNR Family Communication:  No family at bedside  Disposition Plan: To be determined   IV access:  Peripheral IV  Procedures and diagnostic studies:    Dg Chest 2 View 10/03/2014   There is a 2.5 cm masslike lesion in the right lateral mid to lung base. Question 2.8 cm mass in the left lung. Further evaluation with chest CT is recommended.  Consolidation of left lung base with left pleural effusion. This at least in part due to atelectasis but underlying pneumonia is not excluded.     Ct Head Wo Contrast 10/03/2014  No acute intracranial abnormality.  Right subdural hematoma has nearly resolved.  Chronic encephalomalacia in the right cerebral cortex with postsurgical changes.  Ct Chest W Contrast 10/03/2014   Spiculated right middle lobe mass, concerning for primary lung malignancy.  Nodular right lower lobe subpleural thickening. This may represent scarring versus a second mass.  Indeterminate right precarinal lymph node.  Bilateral pleural effusions with subsegmental atelectasis.  P  Dg Shoulder Left Port 10/03/2014  Humeral head positioned anterior to the glenoid on the scapular Y-view.  It is uncertain whether this is due to difficulties in positioning, or represents a true anterior left shoulder dislocation.   Medical Consultants:  PCT  Other Consultants:  PT Nutritionist   IAnti-Infectives:   Vancomycin 8/29 --> Zosyn 8/29 -->  Faye Ramsay, MD  [facility] Pager [phone]  If 7PM-7AM, please contact night-coverage [email or web address] Password [email or web address] 10/05/2014, 1:34 PM   LOS: 2 days   HPI/Subjective: No events overnight.   Objective: Filed Vitals:   10/04/14 1443 10/04/14 2023 10/05/14 0625 10/05/14 1025  BP: 128/71 152/57 150/55 146/80  Pulse: 73 64 67 72  Temp:  98.8 F (37.1 C) 98.2 F (36.8 C)   TempSrc:  Oral Oral   Resp:  20 20   Height:      Weight:      SpO2:  100% 100%     Intake/Output Summary (Last 24 hours) at 10/05/14 1334 Last data filed at 10/05/14 1030  Gross per 24 hour  Intake 2202.5 ml  Output      0 ml  Net 2202.5 ml    Exam:   General:  Pt is alert, not in acute distress, cachectic   Cardiovascular: Regular rate and rhythm, no rubs, no gallops  Respiratory: Clear to auscultation bilaterally, no wheezing, diminished breath sounds at bases   Abdomen: Soft, non tender, non distended, bowel sounds present, no guarding  Extremities: No edema, pulses DP and PT palpable bilaterally  Data Reviewed: Basic Metabolic Panel:  Recent Labs Lab 10/03/14 1420 10/04/14 0830  NA 143 142  K 3.5 3.2*  CL 101 102  CO2 35* 33*  GLUCOSE 116* 89  BUN 18 13  CREATININE 1.10* 0.78  CALCIUM 8.9 8.1*   Liver Function Tests:  Recent Labs Lab 10/03/14 1420  AST 34  ALT 17  ALKPHOS 77  BILITOT 0.9  PROT 6.8  ALBUMIN 3.1*   CBC:  Recent Labs Lab 10/03/14 1420 10/04/14 0830  WBC 6.6 5.6  NEUTROABS 4.4  --   HGB 14.3 12.4  HCT 45.8 40.1  MCV 87.1 87.2  PLT 253 260   Cardiac Enzymes:  Recent Labs Lab 10/03/14 1601  TROPONINI 0.31*   Recent Results (from the past 240 hour(s))  Culture, blood (routine x 2)      Status: None (Preliminary result)   Collection Time: 10/03/14  7:35 PM  Result Value Ref Range Status   Specimen Description BLOOD LEFT ANTECUBITAL  Final   Special Requests IN PEDIATRIC BOTTLE 3 CC  Final   Culture   Final    NO GROWTH 2 DAYS Performed at [facility]    Report Status PENDING  Incomplete  MRSA PCR Screening     Status: None   Collection Time: 10/03/14  8:18 PM  Result Value Ref Range Status   MRSA by PCR NEGATIVE NEGATIVE Final    Comment:        The GeneXpert MRSA Assay (FDA approved for NASAL specimens only), is one component of a comprehensive MRSA colonization surveillance program. It is not intended to diagnose MRSA infection nor to guide or monitor treatment for MRSA infections.  Culture, blood (routine x 2)     Status: None (Preliminary result)   Collection Time: 10/03/14  8:45 PM  Result Value Ref Range Status   Specimen Description BLOOD RIGHT ARM  Final   Special Requests IN PEDIATRIC BOTTLE  [place]  Final   Culture   Final    NO GROWTH 2 DAYS Performed at [facility]    Report Status PENDING  Incomplete     Scheduled Meds: . antiseptic oral rinse  7 mL Mouth Rinse q12n4p  . aspirin  81 mg Oral Daily  . chlorhexidine  15 mL Mouth Rinse BID  . enoxaparin (LOVENOX) injection  30 mg Subcutaneous Q24H  . feeding supplement (ENSURE ENLIVE)  237 mL Oral BID BM  . ferrous sulfate  325 mg Oral Q breakfast  . hydrALAZINE  25 mg Oral TID  . hydrocerin  1 application Topical BID  . loratadine  10 mg Oral Daily  . metoprolol  100 mg Oral BID  . piperacillin-tazobactam (ZOSYN)  IV  3.375 g Intravenous 3 times per day  . simvastatin  10 mg Oral Daily  . vancomycin  500 mg Intravenous Q24H   Continuous Infusions: . sodium chloride 20 mL/hr at 10/03/14 1610  . sodium chloride 75 mL/hr at 10/05/14 0636

## 2014-10-05 NOTE — Care Management Important Message (Signed)
Important Message  Patient Details  Name: Debra York MRN: [id number] Date of Birth: 1924-09-19   Medicare Important Message Given:  Yes-second notification given    Camillo Flaming 10/05/2014, 12:56 [place] Message  Patient Details  Name: Debra York MRN: [id number] Date of Birth: 1924/04/20   Medicare Important Message Given:  Yes-second notification given    Camillo Flaming 10/05/2014, 12:56 PM

## 2014-10-05 NOTE — Evaluation (Signed)
Physical Therapy Evaluation Patient Details Name: Debra York MRN: [id number] DOB: [birth year] Today's Date: 10/05/2014   History of Present Illness  81 yo female admitted 10/04/14  from the nursing facility due to confusion. . She's had a CT of the head which shows a nearly resolved right subdural hematoma. She continues to be hypoxic and is requiring 3 L of oxygen. A CT scan of the chest reveals a right sided effusion and a spiculated right middle lobe mass  Clinical Impression  Patient   Has old L hemiparesis, was WC  Dependent PTA, resided at [facility]. Patient reports that she was assisted to [facility] and could mobilize herself prior to SDH. Patient is probably near baseline for function. Will follow  While in acute care to address problems listed in note below.  No F/U PT at Discharge  Is recommended.     Follow Up Recommendations No PT follow up    Equipment Recommendations  None recommended by PT    Recommendations for Other Services       Precautions / Restrictions Precautions Precautions: Fall Precaution Comments: h/o falls, L hemiperesis      Mobility  Bed Mobility Overal bed mobility: Needs Assistance Bed Mobility: Supine to Sit;Sit to Supine     Supine to sit: HOB elevated;Total assist Sit to supine: Total assist   General bed mobility comments: Pt needs total assist to get to sitting EOB to progress bil legs and help support trunk to get upright on EOB.  assist with legs and trunk back into bed.  Transfers                 General transfer comment: NT, patient appeared SOB after sitting up  Ambulation/Gait                Stairs            Wheelchair Mobility    Modified Rankin (Stroke Patients Only)       Balance Overall balance assessment: History of Falls Sitting-balance support: Feet supported;Single extremity supported Sitting balance-Leahy Scale: Poor Sitting balance - Comments: patient initially leaning to the R, graduallty gained  control and sat a midline with close min guard.                                     Pertinent Vitals/Pain Pain Assessment: Faces Faces Pain Scale: Hurts even more Pain Location: L leg, spasms Pain Descriptors / Indicators: Spasm Pain Intervention(s): Monitored during session;Limited activity within patient's tolerance    Home Living Family/patient expects to be discharged to:: Assisted living                 Additional Comments: [place]    Prior Function     Gait / Transfers Assistance Needed: per pt she needed assist to get into a WC, once in the [facility] she would use her feet to get around.  ADL's / Homemaking Assistance Needed: Assist from aids at ALF        Hand Dominance        Extremity/Trunk Assessment           LUE Deficits / Details: left arm most significantly impaired.  Pt with left hemiperesis flexion contractures at elbow and hand is flaccid.Marland Kitchen       RLE Deficits / Details: right leg generally weak with decreased hamstring flexibility leading to decreased knee extension ROM.   LLE Deficits /  Details: left leg flexion contractures due to left hemiperesis s/p stroke. has minimal active movement about the knee  Cervical / Trunk Assessment: Kyphotic  Communication      Cognition Arousal/Alertness: Awake/alert Behavior During Therapy: WFL for tasks assessed/performed Overall Cognitive Status: No family/caregiver present to determine baseline cognitive functioning Area of Impairment: Orientation               General Comments: pt. oriented to "cone", able to follow some commands    General Comments      Exercises        Assessment/Plan    PT Assessment Patient needs continued PT services  PT Diagnosis Generalized weakness;Acute pain;Hemiplegia non-dominant side   PT Problem List Decreased strength;Decreased range of motion;Decreased activity tolerance;Decreased balance;Decreased mobility;Cardiopulmonary status limiting  activity;Decreased safety awareness  PT Treatment Interventions Functional mobility training;DME instruction;Therapeutic activities;Therapeutic exercise;Patient/family education   PT Goals (Current goals can be found in the Care Plan section) Acute Rehab PT Goals Patient Stated Goal: none stated PT Goal Formulation: With patient Time For Goal Achievement: 10/19/14 Potential to Achieve Goals: Fair    Frequency Min 2X/week   Barriers to discharge        Co-evaluation               End of Session   Activity Tolerance: Patient tolerated treatment well Patient left: in bed;with call bell/phone within reach;with bed alarm set Nurse Communication: Mobility status         Time: 9470-9628 PT Time Calculation (min) (ACUTE ONLY): 17 min   Charges:   PT Evaluation $Initial PT Evaluation Tier I: 1 Procedure     PT G CodesClaretha Cooper 10/05/2014, 12:57 PM Tresa Endo PT [phone]

## 2014-10-06 DIAGNOSIS — L8990 Pressure ulcer of unspecified site, unspecified stage: Secondary | ICD-10-CM | POA: Insufficient documentation

## 2014-10-06 LAB — CBC
HCT: 36.6 % (ref 36.0–46.0)
Hemoglobin: 11.3 g/dL — ABNORMAL LOW (ref 12.0–15.0)
MCH: 27.2 pg (ref 26.0–34.0)
MCHC: 30.9 g/dL (ref 30.0–36.0)
MCV: 88.2 fL (ref 78.0–100.0)
PLATELETS: 283 10*3/uL (ref 150–400)
RBC: 4.15 MIL/uL (ref 3.87–5.11)
RDW: 15.4 % (ref 11.5–15.5)
WBC: 5.6 10*3/uL (ref 4.0–10.5)

## 2014-10-06 LAB — BASIC METABOLIC PANEL
ANION GAP: 4 — AB (ref 5–15)
BUN: 10 mg/dL (ref 6–20)
CO2: 30 mmol/L (ref 22–32)
Calcium: 8.2 mg/dL — ABNORMAL LOW (ref 8.9–10.3)
Chloride: 108 mmol/L (ref 101–111)
Creatinine, Ser: 0.92 mg/dL (ref 0.44–1.00)
GFR calc Af Amer: >60 mL/min (ref >60)
GFR, EST NON AFRICAN AMERICAN: 54 mL/min — AB (ref >60)
Glucose, Bld: 100 mg/dL — ABNORMAL HIGH (ref 65–99)
POTASSIUM: 3.3 mmol/L — AB (ref 3.5–5.1)
SODIUM: 142 mmol/L (ref 135–145)

## 2014-10-06 MED ORDER — HYDRALAZINE HCL 50 MG PO TABS
50.0000 mg | ORAL_TABLET | Freq: Three times a day (TID) | ORAL | Status: DC
  Administered 2014-10-06 – 2014-10-07 (×2): 50 mg via ORAL
  Filled 2014-10-06 (×3): qty 1

## 2014-10-06 MED ORDER — DOXYCYCLINE HYCLATE 100 MG PO TABS
100.0000 mg | ORAL_TABLET | Freq: Two times a day (BID) | ORAL | Status: DC
  Administered 2014-10-06 – 2014-10-07 (×3): 100 mg via ORAL
  Filled 2014-10-06 (×3): qty 1

## 2014-10-06 MED ORDER — POTASSIUM CHLORIDE CRYS ER 20 MEQ PO TBCR
40.0000 meq | EXTENDED_RELEASE_TABLET | Freq: Once | ORAL | Status: AC
  Administered 2014-10-06: 40 meq via ORAL
  Filled 2014-10-06: qty 2

## 2014-10-06 NOTE — Progress Notes (Signed)
Daily Progress Note   Patient Name: Debra York       Date: 10/06/2014 DOB: 1924-09-13  Age: 81 y.o. MRN#: [id number] Attending Physician: Dorothea Ogle, MD Primary Care Physician: Florentina Jenny, MD Admit Date: 10/03/2014  Reason for Consultation/Follow-up: GOC  Subjective:     Ms. Fulop is resting today and is still complaining of hip pain. Continues to refuse to eat. I met with her cousins Johney Frame and his wife Malachi Bonds. Malachi Bonds has updated them on our conversation. They all understand that she likely has cancer and that invasive testing and treatment is not a good option for her. They all agree that with her age and knowing her that they want her to be comfortable. They say they have been very happy and she has been very happy at [facility] for years now. They are in agreement with transfer back to [facility] with hospice to focus on being comfortable for the time she has left. They understand she is not eating/drinking much of anything. They would like her to continue antibiotics as indicated for her pneumonia - told them that this may be related to cancer and may not be reversible. They understand.    Length of Stay: 3 days  Current Medications: Scheduled Meds:  . antiseptic oral rinse  7 mL Mouth Rinse q12n4p  . aspirin  81 mg Oral Daily  . chlorhexidine  15 mL Mouth Rinse BID  . doxycycline  100 mg Oral Q12H  . feeding supplement (ENSURE ENLIVE)  237 mL Oral BID BM  . ferrous sulfate  325 mg Oral Q breakfast  . hydrALAZINE  50 mg Oral TID  . hydrocerin  1 application Topical BID  . loratadine  10 mg Oral Daily  . metoprolol  100 mg Oral BID  . simvastatin  10 mg Oral Daily    Continuous Infusions: . sodium chloride 20 mL/hr (10/05/14 2146)    PRN Meds: acetaminophen **OR** acetaminophen, alum & mag hydroxide-simeth, hydrALAZINE, morphine injection, ondansetron **OR** ondansetron (ZOFRAN) IV  Palliative Performance Scale: 20%     Vital Signs: BP 186/58 mmHg   Pulse 58  Temp(Src) 97.8 F (36.6 C) (Oral)  Resp 20  Ht 5\' 1"  (1.549 m)  Wt 45.8 kg (100 lb 15.5 oz)  BMI 19.09 kg/m2  SpO2 100% SpO2: SpO2: 100 % O2 Device: O2 Device: Nasal Cannula O2 Flow Rate: O2 Flow Rate (L/min): 3 L/min  Intake/output summary:  Intake/Output Summary (Last 24 hours) at 10/06/14 1355 Last data filed at 10/06/14 0700  Gross per 24 hour  Intake 1576.67 ml  Output      0 ml  Net 1576.67 ml   LBM: Last BM Date: 10/05/14 Baseline Weight: Weight: 45.8 kg (100 lb 15.5 oz) Most recent weight: Weight: 45.8 kg (100 lb 15.5 oz)  Physical Exam: General: NAD, thin, frail HEENT: Temporal muscle wasting Resp: No labored breathing Abd: Soft, NT, ND Neuro: Awake, alert, oriented to person only (difficult to understand at times to further discuss)    Additional Data Reviewed: Recent Labs     10/04/14  0830  10/06/14  0457  WBC  5.6  5.6  HGB  12.4  11.3*  PLT  260  283  NA  142  142  BUN  13  10  CREATININE  0.78  0.92     Problem List:  Patient Active Problem List   Diagnosis Date Noted  . Pressure ulcer 10/06/2014  . Palliative care encounter 10/05/2014  .  Personal history of DVT (deep vein thrombosis) 10/04/2014  . Lung mass 10/03/2014  . Acute respiratory failure with hypoxemia 10/03/2014  . Hypertensive urgency 10/03/2014  . Pleural effusion, right 10/03/2014  . Hypokalemia 09/28/2014  . possible Pneumonia 09/28/2014  . Forehead abrasion 09/28/2014  . History of stroke 09/27/2014  . Hyperlipidemia 09/27/2014  . Subdural hematoma 09/26/2014     Palliative Care Assessment & Plan    Code Status:  DNR  Goals of Care:  Comfort focus  3. Symptom Management:  Pain: Tylenol did not relieve pain. Morphine 0.5 mg IV every 4 hours prn - may increase as needed.  4. Prognosis: < 6 months  5. Discharge Planning: Return to [facility] with hospice.    Thank you for allowing the Palliative Medicine Team to assist in the care of this  patient.   Time In: 1330 Time Out: 1210 Total Time 54min Prolonged Time Billed  no    Greater than 50%  of this time was spent counseling and coordinating care related to the above assessment and plan.  Vinie Sill, NP Palliative Medicine Team Pager # [phone] (M-F 8a-5p) Team Phone # [phone] (Nights/Weekends)  10/06/2014, 1:55 PM

## 2014-10-06 NOTE — Progress Notes (Signed)
CSW continuing to follow.   CSW received notification from PMT NP, Vinie Sill that she spoke with pt cousin, Arnell Sieving and his wife, Peter Congo today and they are in agreement with pt returning to [facility] with hospice service.   CSW contacted [facility] ALF to discuss. CSW discussed with [facility] ALF recommendation to return with hospice. CSW discussed that pt is requiring total assist and [facility] stated that pt was requiring total assist prior to admission. [place] uses [facility] [place] and [place] is agreeable to accept pt back with hospice following.   CSW contacted [facility] RN liaison, [place] and made referral for hospice to follow at ALF.   CSW to continue to follow to provide support and assist with pt discharge back to [facility] with hospice.  Alison Murray, MSW, [place] Work [phone]

## 2014-10-06 NOTE — Progress Notes (Addendum)
Patient ID: Debra York, female   DOB: 1924/11/17, 81 y.o.   MRN: [id number]  [facility] PROGRESS NOTE  Codie Hainer DXI:[id number] DOB: [birth year] DOA: 10/03/2014 PCP: Reymundo Poll, MD   Brief narrative:    Patient is 81 year old female with known history of CVA and left-sided weakness with contracted extremities at baseline, hypertension, admitted from August 22 through August 24 after fall and right subdural hematoma. Neurosurgery at that time recommended observation. On previous hospitalization patient also treated for right lower lobe pneumonia and was discharged on Avelox. She was discharged to skilled nursing facility and now presented back to [facility] emergency department for evaluation of persistent confusion. Repeat CT head noted resolved right subdural hematoma and since patient continued to remain hypoxic requiring 3 L of oxygen, CT chest required and revealed right-sided effusion, questionable right middle lobe mass. [facility] asked to admit for further evaluation.  Assessment/Plan:    Principal Problem:   Acute respiratory failure with hypoxemia - ? HCAP, right middle lobe PNA, right pleural effusion, ? Lung cancer and malignant effusion - Placed on broad-spectrum anti-biotic vancomycin and Zosyn - change to oral Doxycycline today 9/1 - Regarding possible lung cancer, palliative care team consulted to address goals of care prior to any further workup  Active Problems:   Acute encephalopathy - Possibly related to acute illness imposed on progressive FTT, hx of stroke, recent subdural hematoma - mental status better this AM but still confused intermittently  - treating PNA, ? return to [facility] with hospice.  - PCT consulted, appreciate assistance     Subdural hematoma - per repeat CT head, resolved    Lung mass, RML and ? RLL  - will follow up on PCT discussions prior to further work up     Hypertensive urgency - continue hydralazine and metoprolol -  added Hydralazine as needed    Hypokalemia - supplement and repeat BMP in AM    Acute renal failure - pre renal in etiology - IVF have been provided and Cr is now WNL    Elevated troponin on 8/29 - possible from demand ischemia  - unable to [facility] due to subdural hematoma - PCT consulted - pt is not candidate for cardiac intervention - no need to cycle enzymes as pt denies chest pain     Left arm brachial DVT - venous duplex today: LUE + for indeterminate age deep vein thrombosis involving the a single left brachial vein - due to subdural, unable to anticoagulate     CVA  - with left sided weakness, left facial droop - cont ASA and statin    Protein calorie malnutrition - moderate, nutritionist consulted   DVT prophylaxis - SCD's  Code Status: DNR Family Communication:  No family at bedside  Disposition Plan: SNF in 24 hours   IV access:  Peripheral IV  Procedures and diagnostic studies:    Dg Chest 2 View 10/03/2014   There is a 2.5 cm masslike lesion in the right lateral mid to lung base. Question 2.8 cm mass in the left lung. Further evaluation with chest CT is recommended.  Consolidation of left lung base with left pleural effusion. This at least in part due to atelectasis but underlying pneumonia is not excluded.     Ct Head Wo Contrast 10/03/2014  No acute intracranial abnormality.  Right subdural hematoma has nearly resolved.  Chronic encephalomalacia in the right cerebral cortex with postsurgical changes.     Ct Chest W Contrast 10/03/2014   Spiculated right  middle lobe mass, concerning for primary lung malignancy.  Nodular right lower lobe subpleural thickening. This may represent scarring versus a second mass.  Indeterminate right precarinal lymph node.  Bilateral pleural effusions with subsegmental atelectasis.  P  Dg Shoulder Left Port 10/03/2014  Humeral head positioned anterior to the glenoid on the scapular Y-view. It is uncertain whether this is due to  difficulties in positioning, or represents a true anterior left shoulder dislocation.   Medical Consultants:  PCT  Other Consultants:  PT Nutritionist   IAnti-Infectives:   Vancomycin 8/29 --> 9/1 Zosyn 8/29 --> 9/1 Doxycycline 9/1 -->  Faye Ramsay, MD  [facility] Pager [phone]  If 7PM-7AM, please contact night-coverage [email or web address] Password [email or web address] 10/06/2014, 7:03 AM   LOS: 3 days   HPI/Subjective: No events overnight.   Objective: Filed Vitals:   10/05/14 1025 10/05/14 1334 10/05/14 2043 10/06/14 0548  BP: 146/80 162/53 156/63 176/55  Pulse: 72 57 65 58  Temp:  98.8 F (37.1 C) 98.4 F (36.9 C) 98.8 F (37.1 C)  TempSrc:  Oral Oral Oral  Resp:  21 20 18   Height:      Weight:      SpO2:  99% 99% 100%    Intake/Output Summary (Last 24 hours) at 10/06/14 0703 Last data filed at 10/06/14 0147  Gross per 24 hour  Intake 1776.67 ml  Output      0 ml  Net 1776.67 ml    Exam:   General:  Pt is sleepy this AM, intermittently confused, NAD, cachectic   Cardiovascular: Regular rate and rhythm, no rubs, no gallops  Respiratory: Clear to auscultation bilaterally, no wheezing, diminished breath sounds at bases   Abdomen: Soft, non tender, non distended, bowel sounds present, no guarding  Extremities: No edema, pulses DP and PT palpable bilaterally  Data Reviewed: Basic Metabolic Panel:  Recent Labs Lab 10/03/14 1420 10/04/14 0830 10/06/14 0457  NA 143 142 142  K 3.5 3.2* 3.3*  CL 101 102 108  CO2 35* 33* 30  GLUCOSE 116* 89 100*  BUN 18 13 10   CREATININE 1.10* 0.78 0.92  CALCIUM 8.9 8.1* 8.2*   Liver Function Tests:  Recent Labs Lab 10/03/14 1420  AST 34  ALT 17  ALKPHOS 77  BILITOT 0.9  PROT 6.8  ALBUMIN 3.1*   CBC:  Recent Labs Lab 10/03/14 1420 10/04/14 0830 10/06/14 0457  WBC 6.6 5.6 5.6  NEUTROABS 4.4  --   --   HGB 14.3 12.4 11.3*  HCT 45.8 40.1 36.6  MCV 87.1 87.2 88.2  PLT 253 260 283   Cardiac Enzymes:  Recent  Labs Lab 10/03/14 1601  TROPONINI 0.31*   Recent Results (from the past 240 hour(s))  Culture, blood (routine x 2)     Status: None (Preliminary result)   Collection Time: 10/03/14  7:35 PM  Result Value Ref Range Status   Specimen Description BLOOD LEFT ANTECUBITAL  Final   Special Requests IN PEDIATRIC BOTTLE 3 CC  Final   Culture   Final    NO GROWTH 2 DAYS Performed at [facility]    Report Status PENDING  Incomplete  MRSA PCR Screening     Status: None   Collection Time: 10/03/14  8:18 PM  Result Value Ref Range Status   MRSA by PCR NEGATIVE NEGATIVE Final    Comment:        The GeneXpert MRSA Assay (FDA approved for NASAL specimens only), is one component of a comprehensive MRSA colonization  surveillance program. It is not intended to diagnose MRSA infection nor to guide or monitor treatment for MRSA infections.   Culture, blood (routine x 2)     Status: None (Preliminary result)   Collection Time: 10/03/14  8:45 PM  Result Value Ref Range Status   Specimen Description BLOOD RIGHT ARM  Final   Special Requests IN PEDIATRIC BOTTLE  [place]  Final   Culture   Final    NO GROWTH 2 DAYS Performed at [facility]    Report Status PENDING  Incomplete     Scheduled Meds: . aspirin  81 mg Oral Daily  . ferrous sulfate  325 mg Oral Q breakfast  . hydrALAZINE  25 mg Oral TID  . hydrocerin  1 application Topical BID  . loratadine  10 mg Oral Daily  . metoprolol  100 mg Oral BID  . piperacillin-tazobactam (ZOSYN)  IV  3.375 g Intravenous 3 times per day  . simvastatin  10 mg Oral Daily  . vancomycin  500 mg Intravenous Q24H   Continuous Infusions: . sodium chloride 20 mL/hr (10/05/14 2146)

## 2014-10-06 NOTE — Care Management Important Message (Signed)
imImportant Message  Patient Details  Name: Debra York MRN: [id number] Date of Birth: [birth year]   Medicare Important Message Given:  Yes-third notification given    Camillo Flaming 10/06/2014, 2:31 [place] Message  Patient Details  Name: Debra York MRN: [id number] Date of Birth: February 27, 1924   Medicare Important Message Given:  Yes-third notification given    Camillo Flaming 10/06/2014, 2:31 PM

## 2014-10-06 NOTE — Progress Notes (Addendum)
Notified by [facility] of pt./family request for [facility] services at [place] after discharge.  Writer spoke with pt's cousin, Arnell Sieving briefly on the phone to initiate education related to hospice philosophy, servides and team approach to care. He voiced understanding of information provided. Per discussion plan is for discharge to AL by [facility]. Tomorrow.  Please send signed and completed DNR form with patient. Pt. will need prescription for discharge comfort medications.  DME needs discussed with Belinda from [facility] who is requesting a hospital bed and oxygen. [facility] equipment manager notified and will Contact [facility] to arrange delivery to the AL. The contact person to arrange the time of delivery is Belinda.  [facility] aware of above. Please notify [facility] when pt. Is ready to leave unit at discharge-call [phone] ( or [phone] after 5p).  Liaison spoke briefly with patient about hospice and indicated yes she knew. Please call with any questions.  [facility] Liaison [phone]

## 2014-10-07 LAB — BASIC METABOLIC PANEL
ANION GAP: 8 (ref 5–15)
BUN: 8 mg/dL (ref 6–20)
CALCIUM: 8.7 mg/dL — AB (ref 8.9–10.3)
CO2: 27 mmol/L (ref 22–32)
Chloride: 110 mmol/L (ref 101–111)
Creatinine, Ser: 0.95 mg/dL (ref 0.44–1.00)
GFR, EST NON AFRICAN AMERICAN: 52 mL/min — AB (ref >60)
Glucose, Bld: 115 mg/dL — ABNORMAL HIGH (ref 65–99)
POTASSIUM: 3.9 mmol/L (ref 3.5–5.1)
Sodium: 145 mmol/L (ref 135–145)

## 2014-10-07 LAB — CBC
HEMATOCRIT: 39.5 % (ref 36.0–46.0)
Hemoglobin: 11.8 g/dL — ABNORMAL LOW (ref 12.0–15.0)
MCH: 27.1 pg (ref 26.0–34.0)
MCHC: 29.9 g/dL — ABNORMAL LOW (ref 30.0–36.0)
MCV: 90.8 fL (ref 78.0–100.0)
Platelets: 268 10*3/uL (ref 150–400)
RBC: 4.35 MIL/uL (ref 3.87–5.11)
RDW: 15.5 % (ref 11.5–15.5)
WBC: 5.3 10*3/uL (ref 4.0–10.5)

## 2014-10-07 MED ORDER — DOXYCYCLINE HYCLATE 100 MG PO TABS: 100.0000 mg | ORAL_TABLET | Freq: Two times a day (BID) | ORAL | Status: AC

## 2014-10-07 MED ORDER — METOPROLOL TARTRATE 50 MG PO TABS
100.0000 mg | ORAL_TABLET | Freq: Two times a day (BID) | ORAL | Status: DC
  Filled 2014-10-07: qty 2

## 2014-10-07 MED ORDER — HYDROCODONE-ACETAMINOPHEN 5-325 MG PO TABS: 1.0000 | ORAL_TABLET | Freq: Four times a day (QID) | ORAL | Status: AC | PRN

## 2014-10-07 NOTE — Discharge Summary (Addendum)
Physician Discharge Summary  Debra York KJZ:[id number] DOB: Jun 12, 1924 DOA: 10/03/2014  PCP: Reymundo Poll, MD  Admit date: 10/03/2014 Discharge date: 10/07/2014  Recommendations for Outpatient Follow-up:  1. Pt will need to follow up with PCP in 2-3 weeks post discharge 2. Pt to continue taking doxycycline for 5 more days post discharge 3. Pt to be discharged to ALF with hospice   Discharge Diagnoses:  Principal Problem:   Acute respiratory failure with hypoxemia Active Problems:   Subdural hematoma   Lung mass   Hypertensive urgency   Pleural effusion, right   Personal history of DVT (deep vein thrombosis)   Palliative care encounter   Pressure ulcer  Discharge Condition: Stable  Diet recommendation: As tolerated    Brief narrative:    Patient is 81 year old female with known history of CVA and left-sided weakness with contracted extremities at baseline, hypertension, admitted from August 22 through August 24 after fall and right subdural hematoma. Neurosurgery at that time recommended observation. On previous hospitalization patient also treated for right lower lobe pneumonia and was discharged on Avelox. She was discharged to skilled nursing facility and now presented back to [facility] emergency department for evaluation of persistent confusion. Repeat CT head noted resolved right subdural hematoma and since patient continued to remain hypoxic requiring 3 L of oxygen, CT chest required and revealed right-sided effusion, questionable right middle lobe mass. TR[facility] asked to admit for further evaluation.  Assessment/Plan:    Principal Problem:  Acute respiratory failure with hypoxemia - HCAP, right middle lobe PNA, right pleural effusion, ? Lung cancer and malignant effusion - Placed on broad-spectrum anti-biotic vancomycin and Zosyn - changed to oral Doxycycline today 9/1, pt to continue taking doxycycline for 5 more days post d/c - Regarding possible lung cancer,  palliative care team consulted, focus on comfort, discharge to ALF with hospice, no further work up desired per pt and family   Active Problems:  Acute encephalopathy - Possibly related to acute illness imposed on progressive FTT, hx of stroke, recent subdural hematoma - mental status better this AM but still confused intermittently  - treating PNA, ALF with hospice today    Subdural hematoma - per repeat CT head, resolved   Lung mass, RML and ? RLL  - no further work up, focus on comfort and d/c with hospice   Hypertensive urgency - continue hydralazine and metoprolol   Hypokalemia - supplemented and WNL   Acute renal failure - pre renal in etiology - IVF have been provided and Cr is now WNL   Elevated troponin on 8/29 - possible from demand ischemia  - unable to [facility] due to subdural hematoma - pt is not candidate for cardiac intervention - no need to cycle enzymes as pt denies chest pain    Left arm brachial DVT - venous duplex today: LUE + for indeterminate age deep vein thrombosis involving the a single left brachial vein - due to subdural, unable to anticoagulate    CVA  - with left sided weakness, left facial droop at baseline   Protein calorie malnutrition - moderate, nutritionist consulted  - continue feeding supplements   Code Status: DNR Family Communication: No family at bedside  Disposition Plan: ALF with hospice   IV access:  Peripheral IV  Procedures and diagnostic studies:   Dg Chest 2 View 10/03/2014 There is a 2.5 cm masslike lesion in the right lateral mid to lung base. Question 2.8 cm mass in the left lung. Further evaluation with chest CT  is recommended. Consolidation of left lung base with left pleural effusion. This at least in part due to atelectasis but underlying pneumonia is not excluded.   Ct Head Wo Contrast 10/03/2014 No acute intracranial abnormality. Right subdural hematoma has nearly resolved. Chronic  encephalomalacia in the right cerebral cortex with postsurgical changes.   Ct Chest W Contrast 10/03/2014 Spiculated right middle lobe mass, concerning for primary lung malignancy. Nodular right lower lobe subpleural thickening. This may represent scarring versus a second mass. Indeterminate right precarinal lymph node. Bilateral pleural effusions with subsegmental atelectasis. P  Dg Shoulder Left Port 10/03/2014 Humeral head positioned anterior to the glenoid on the scapular Y-view. It is uncertain whether this is due to difficulties in positioning, or represents a true anterior left shoulder dislocation.   Medical Consultants:  PCT  Other Consultants:  PT Nutritionist   IAnti-Infectives:   Vancomycin 8/29 --> 9/1 Zosyn 8/29 --> 9/1 Doxycycline 9/1 --> 5 more days post discharge       Discharge Exam: Filed Vitals:   10/07/14 0934  BP: 133/47  Pulse: 54  Temp:   Resp:    Filed Vitals:   10/06/14 2206 10/06/14 2324 10/07/14 0615 10/07/14 0934  BP: 174/43 142/44 157/68 133/47  Pulse: 53 52 54 54  Temp: 98.1 F (36.7 C)  98.1 F (36.7 C)   TempSrc: Oral  Axillary   Resp: 16  16   Height:      Weight:      SpO2: 100%  100%     General: Pt is alert, not in acute distress Cardiovascular: Regular rate and rhythm, no rubs, no gallops Respiratory: Clear to auscultation bilaterally, no wheezing, mild rhonchi at bases  Abdominal: Soft, non tender, non distended, bowel sounds +, no guarding  Discharge Instructions  Discharge Instructions    Diet - low sodium heart healthy    Complete by:  As directed      Increase activity slowly    Complete by:  As directed             Medication List    STOP taking these medications        moxifloxacin 400 MG tablet  Commonly known as:  AVELOX      TAKE these medications        acetaminophen 325 MG tablet  Commonly known as:  TYLENOL  Take 650 mg by mouth every 6 (six) hours as needed for mild pain, fever  or headache.     aspirin 81 MG chewable tablet  Chew 81 mg by mouth daily.     cetirizine 10 MG tablet  Commonly known as:  ZYRTEC  Take 10 mg by mouth daily.     cholecalciferol 400 UNITS Tabs tablet  Commonly known as:  VITAMIN D  Take 800 Units by mouth.     doxycycline 100 MG tablet  Commonly known as:  VIBRA-TABS  Take 1 tablet (100 mg total) by mouth every 12 (twelve) hours.     ferrous sulfate 325 (65 FE) MG tablet  Take 325 mg by mouth daily with breakfast.     hydrALAZINE 25 MG tablet  Commonly known as:  APRESOLINE  Take 25 mg by mouth 3 (three) times daily.     hydrocerin Crea  Apply 1 application topically 2 (two) times daily. To legs and feet.     HYDROcodone-acetaminophen 5-325 MG per tablet  Commonly known as:  NORCO/VICODIN  Take 1 tablet by mouth every 6 (six) hours as needed for  moderate pain.     metoprolol 100 MG tablet  Commonly known as:  LOPRESSOR  Take 100 mg by mouth 2 (two) times daily.     simvastatin 10 MG tablet  Commonly known as:  ZOCOR  Take 10 mg by mouth daily.     sucralfate 1 G tablet  Commonly known as:  CARAFATE  Take 1 g by mouth 4 (four) times daily -  with meals and at bedtime.            Follow-up Information    Follow up with Reymundo Poll, MD.   Specialty:  Family Medicine   Contact information:   [address] [place] [address] [phone]        The results of significant diagnostics from this hospitalization (including imaging, microbiology, ancillary and laboratory) are listed below for reference.     Microbiology: Recent Results (from the past 240 hour(s))  Culture, blood (routine x 2)     Status: None (Preliminary result)   Collection Time: 10/03/14  7:35 PM  Result Value Ref Range Status   Specimen Description BLOOD LEFT ANTECUBITAL  Final   Special Requests IN PEDIATRIC BOTTLE 3 CC  Final   Culture   Final    NO GROWTH 3 DAYS Performed at [facility]    Report  Status PENDING  Incomplete  MRSA PCR Screening     Status: None   Collection Time: 10/03/14  8:18 PM  Result Value Ref Range Status   MRSA by PCR NEGATIVE NEGATIVE Final    Comment:        The GeneXpert MRSA Assay (FDA approved for NASAL specimens only), is one component of a comprehensive MRSA colonization surveillance program. It is not intended to diagnose MRSA infection nor to guide or monitor treatment for MRSA infections.   Culture, blood (routine x 2)     Status: None (Preliminary result)   Collection Time: 10/03/14  8:45 PM  Result Value Ref Range Status   Specimen Description BLOOD RIGHT ARM  Final   Special Requests IN PEDIATRIC BOTTLE  [place]  Final   Culture   Final    NO GROWTH 3 DAYS Performed at [facility]    Report Status PENDING  Incomplete     Labs: Basic Metabolic Panel:  Recent Labs Lab 10/03/14 1420 10/04/14 0830 10/06/14 0457 10/07/14 0538  NA 143 142 142 145  K 3.5 3.2* 3.3* 3.9  CL 101 102 108 110  CO2 35* 33* 30 27  GLUCOSE 116* 89 100* 115*  BUN 18 13 10 8   CREATININE 1.10* 0.78 0.92 0.95  CALCIUM 8.9 8.1* 8.2* 8.7*   Liver Function Tests:  Recent Labs Lab 10/03/14 1420  AST 34  ALT 17  ALKPHOS 77  BILITOT 0.9  PROT 6.8  ALBUMIN 3.1*   CBC:  Recent Labs Lab 10/03/14 1420 10/04/14 0830 10/06/14 0457 10/07/14 0538  WBC 6.6 5.6 5.6 5.3  NEUTROABS 4.4  --   --   --   HGB 14.3 12.4 11.3* 11.8*  HCT 45.8 40.1 36.6 39.5  MCV 87.1 87.2 88.2 90.8  PLT 253 260 283 268   Cardiac Enzymes:  Recent Labs Lab 10/03/14 1601  TROPONINI 0.31*   BNP: BNP (last 3 results)  Recent Labs  09/26/14 2216 10/03/14 1601  BNP 1808.5* 1270.2*    SIGNED: Time coordinating discharge:  30 minutes  Faye Ramsay, MD  [facility] 10/07/2014, 10:28 AM Pager [phone]  If  7PM-7AM, please contact night-coverage [email or web address] Password [email or web address]

## 2014-10-07 NOTE — Progress Notes (Signed)
Called report to Tonia Ghent at [facility].  Barbee Shropshire. Brigitte Pulse, RN

## 2014-10-07 NOTE — Discharge Instructions (Signed)

## 2014-10-07 NOTE — Progress Notes (Signed)
Pt for discharge to [facility] with [facility].   CSW facilitated pt discharge needs including contacting facility, faxing pt discharge information to [facility] and confirming facility reviewed information, confirmed with [employer] that equipment had arrived from [facility], discussed with pt cousin, Latoshia Monrroy via telephone, provided RN phone number to call report, and arranged ambulance transport for pt to [facility].   No further social work needs identified at this time.  CSW signing off.   Alison Murray, MSW, [facility] [phone]

## 2014-10-08 LAB — CULTURE, BLOOD (ROUTINE X 2)
CULTURE: NO GROWTH
Culture: NO GROWTH

## 2015-02-05 DEATH — deceased

## 2015-12-14 IMAGING — CT CT CHEST W/ CM
2 of 3 series · 15 of 36 positions shown, 18 images · IV contrast (omnipaque)
Comparison: Chest x-ray dated 10/03/2014

CLINICAL DATA: Shortness of breath.  Possible right lung mass.

EXAM:
CT CHEST WITH CONTRAST
TECHNIQUE: Multidetector CT imaging of the chest was performed during
intravenous contrast administration.
CONTRAST:  75mL OMNIPAQUE IOHEXOL 300 MG/ML  SOLN

[Series 2: chest with st · axial · 0.59mm/px · z∈[+1092,+1332]mm · 12 of 58 slices shown, 15 images]
[im 5/58  mediastinal]
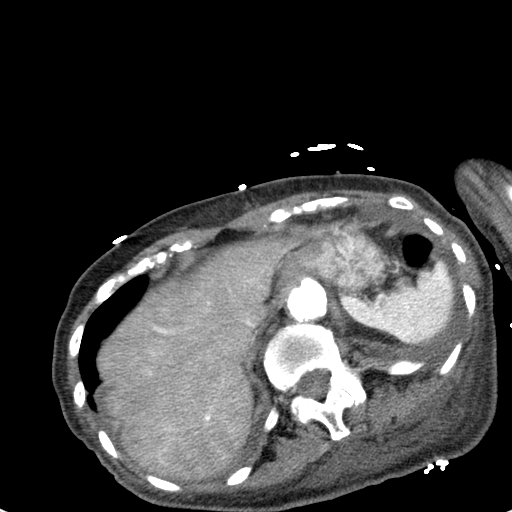
[im 5/58  lung]
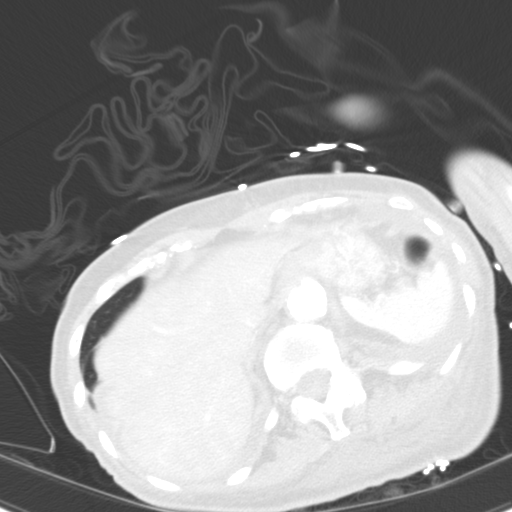
[im 9/58  lung]
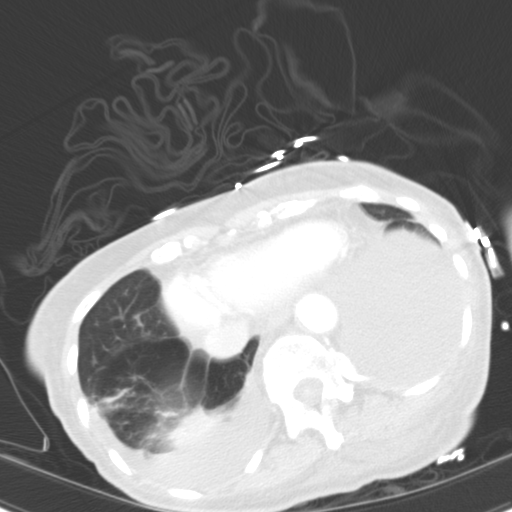
[im 13/58  lung]
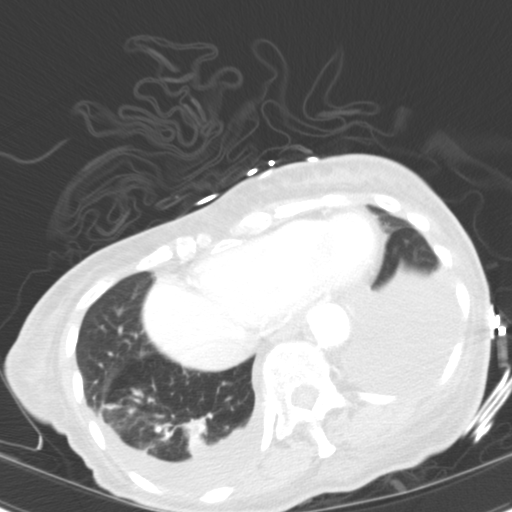
[im 17/58  lung]
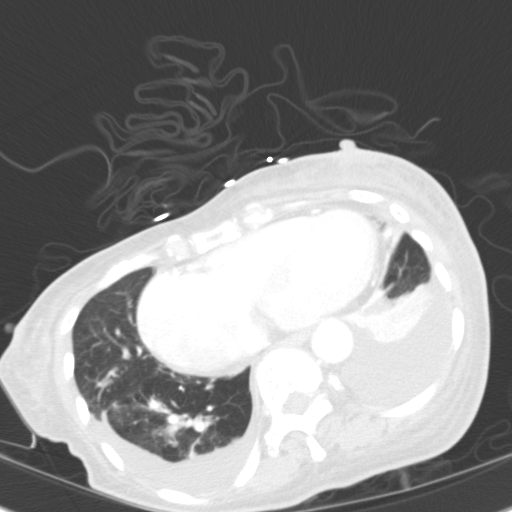
[im 22/58  mediastinal]
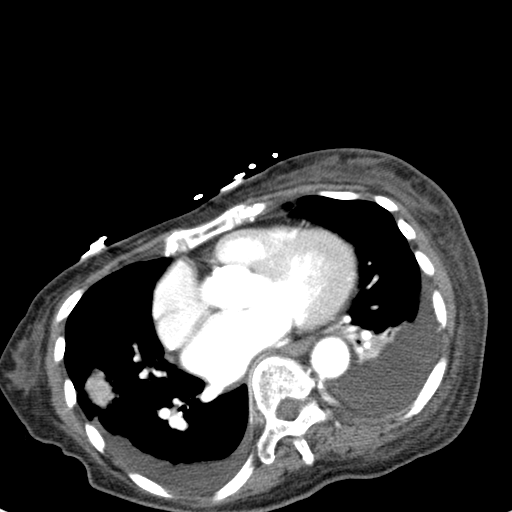
[im 22/58  lung]
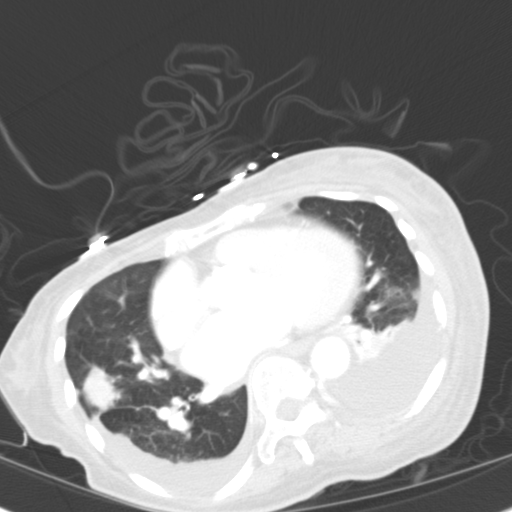
[im 26/58  lung]
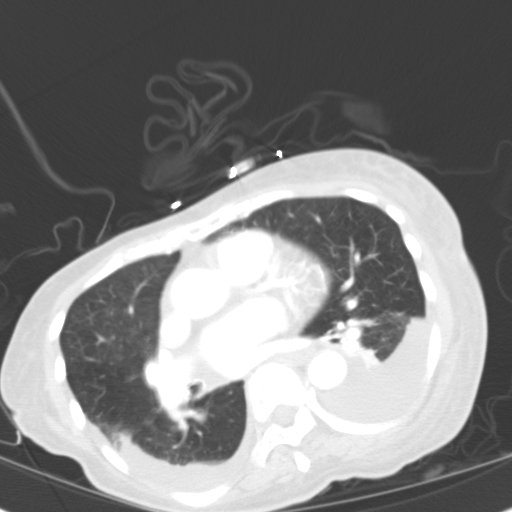
[im 32/58  lung]
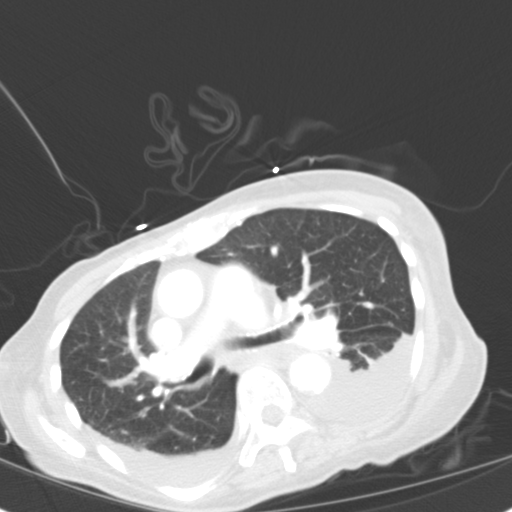
[im 36/58  lung]
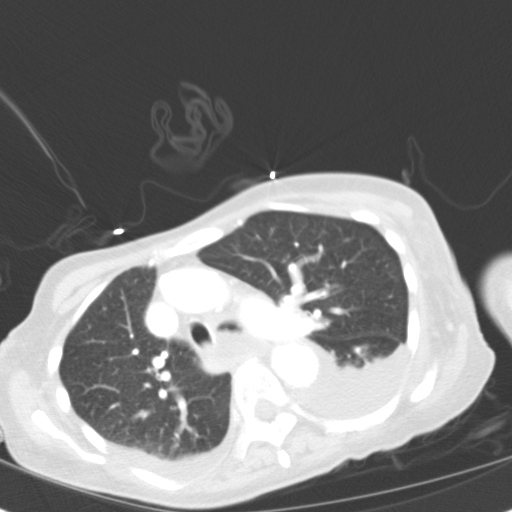
[im 41/58  mediastinal]
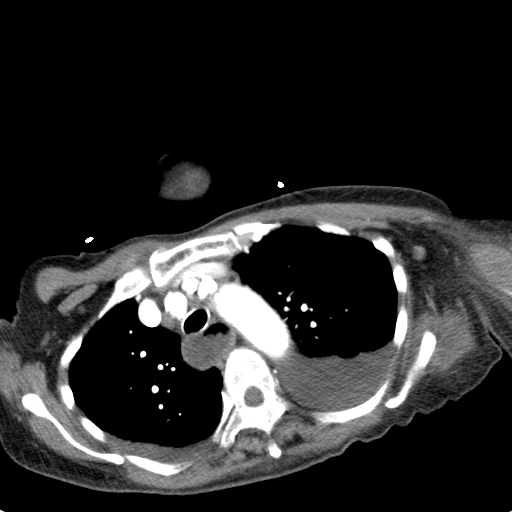
[im 41/58  lung]
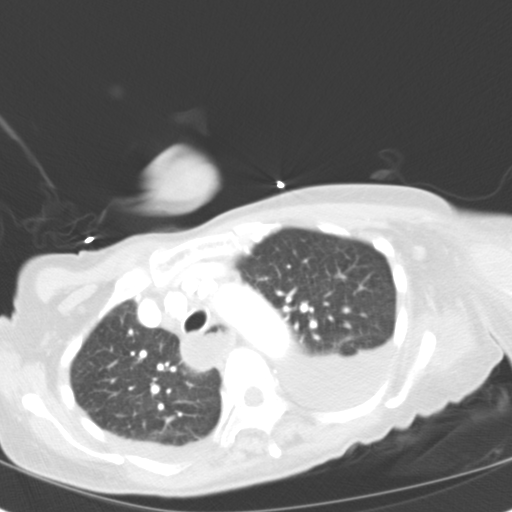
[im 45/58  lung]
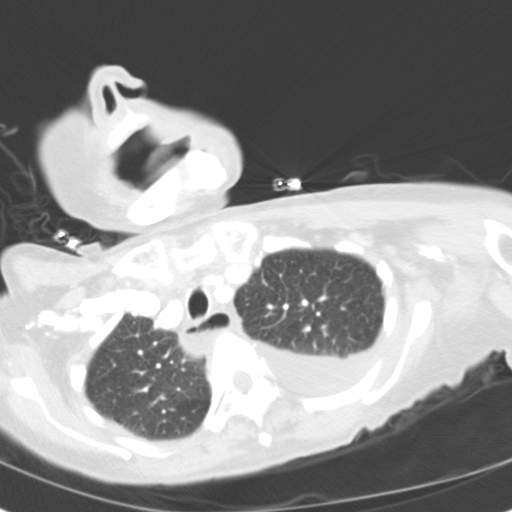
[im 49/58  lung]
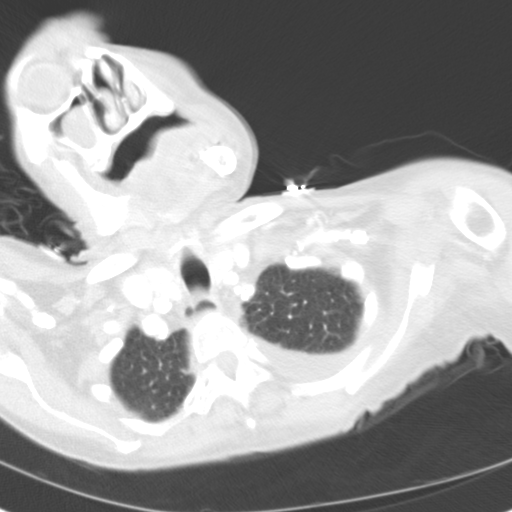
[im 53/58  lung]
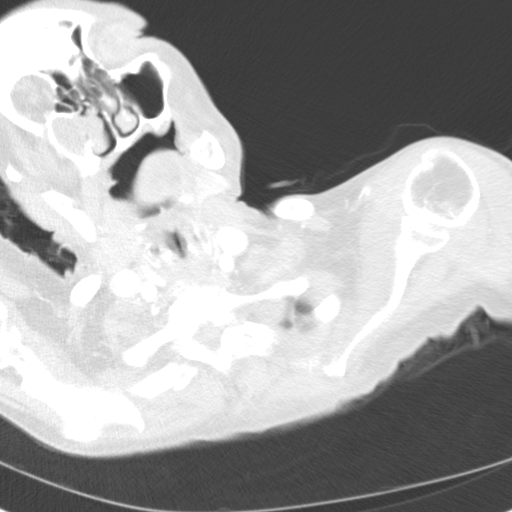

[Series 3: coronals · coronal · 0.52mm/px · 3 of 64 slices shown]
[im 13/64  lung]
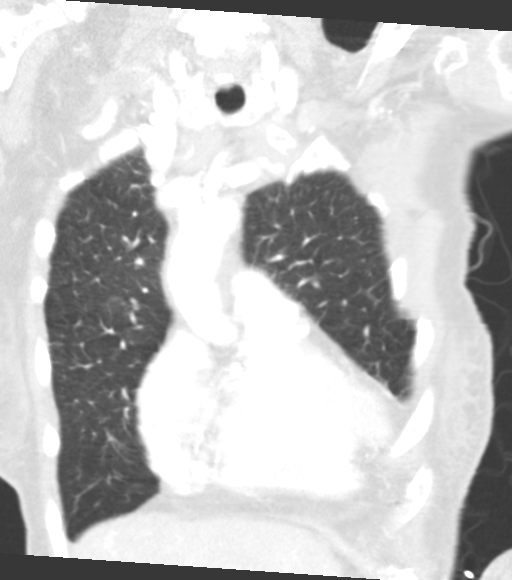
[im 26/64  lung]
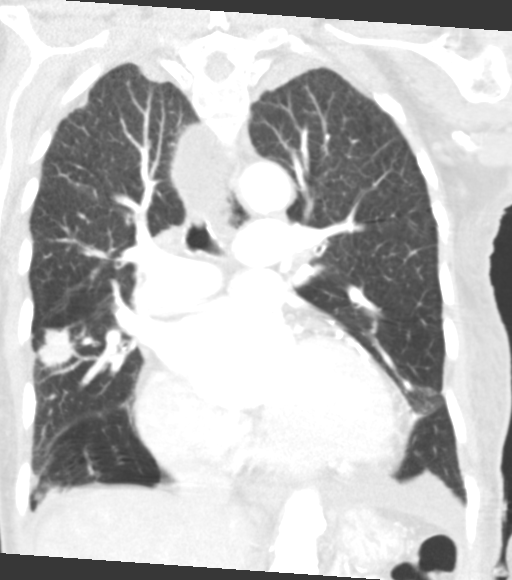
[im 38/64  lung]
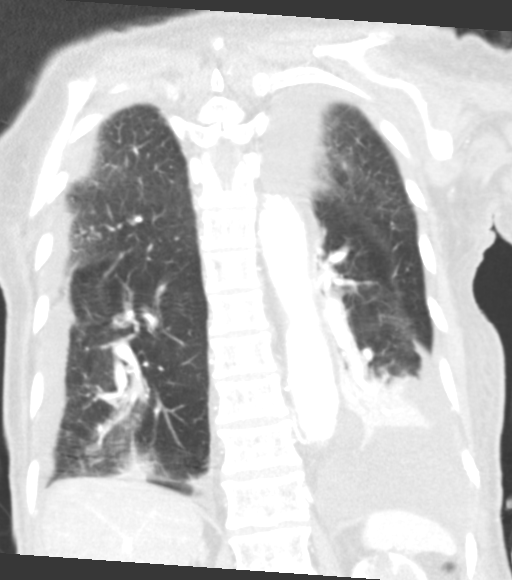

[15 of 36 positions shown; findings below may reference images not displayed]

FINDINGS: There is no evidence of pulmonary embolus. The heart size seems
enlarged. There is no pericardial effusion. There are heavy
atherosclerotic calcifications of the aorta.

There is a 1.2 cm right precarinal lymph node with rounded
appearance. The esophagus is dilated for most of its course.

There is a spiculated soft tissue mass within the lateral aspect of
the right middle lobe which measures 2.5 by 1.8 by 2.0 cm,
concerning for primary lung malignancy. Spiculations extend into the
lateral pleural and interlobar fissure planes. There is also a
nodular subpleural scarring in the right lower lobe causing tenting
measuring 1.8 by 1.4 cm. Ground-glass opacities are seen within the
right lower lobe. There are bilateral pleural effusion with
subsegmental bibasilar atelectasis. No axillary lymphadenopathy is
seen. The visualized portions of the thyroid gland are unremarkable
in appearance.

The visualized portions of the stomach, liver and spleen are
unremarkable.

No acute osseous abnormalities are seen.
IMPRESSION: Spiculated right middle lobe mass, concerning for primary lung
malignancy.

Nodular right lower lobe subpleural thickening. This may represent
scarring versus a second mass.

Indeterminate right precarinal lymph node.

Bilateral pleural effusions with subsegmental atelectasis.

PET-CT or tissue sampling may be considered for further diagnosis.
